# Patient Record
Sex: Male | Born: 1955 | Race: White | Hispanic: No | Marital: Single | Smoking: Current every day smoker
Health system: Southern US, Community
[De-identification: ages and names within clinical notes are randomized; demographics above are authoritative.]

## PROBLEM LIST (undated history)

## (undated) DIAGNOSIS — R61 Generalized hyperhidrosis: Secondary | ICD-10-CM

## (undated) DIAGNOSIS — Z86010 Personal history of colonic polyps: Secondary | ICD-10-CM

## (undated) DIAGNOSIS — E785 Hyperlipidemia, unspecified: Secondary | ICD-10-CM

## (undated) DIAGNOSIS — M1990 Unspecified osteoarthritis, unspecified site: Secondary | ICD-10-CM

## (undated) DIAGNOSIS — F329 Major depressive disorder, single episode, unspecified: Secondary | ICD-10-CM

## (undated) DIAGNOSIS — R1110 Vomiting, unspecified: Secondary | ICD-10-CM

## (undated) DIAGNOSIS — F1020 Alcohol dependence, uncomplicated: Secondary | ICD-10-CM

## (undated) DIAGNOSIS — K589 Irritable bowel syndrome without diarrhea: Secondary | ICD-10-CM

## (undated) DIAGNOSIS — Z86718 Personal history of other venous thrombosis and embolism: Secondary | ICD-10-CM

## (undated) DIAGNOSIS — J449 Chronic obstructive pulmonary disease, unspecified: Secondary | ICD-10-CM

## (undated) DIAGNOSIS — K069 Disorder of gingiva and edentulous alveolar ridge, unspecified: Secondary | ICD-10-CM

## (undated) DIAGNOSIS — M797 Fibromyalgia: Secondary | ICD-10-CM

## (undated) DIAGNOSIS — R110 Nausea: Secondary | ICD-10-CM

## (undated) DIAGNOSIS — I10 Essential (primary) hypertension: Secondary | ICD-10-CM

## (undated) DIAGNOSIS — F32A Depression, unspecified: Secondary | ICD-10-CM

## (undated) DIAGNOSIS — K219 Gastro-esophageal reflux disease without esophagitis: Secondary | ICD-10-CM

## (undated) DIAGNOSIS — R197 Diarrhea, unspecified: Secondary | ICD-10-CM

## (undated) HISTORY — DX: Nausea: R11.0

## (undated) HISTORY — PX: APPENDECTOMY: SHX54

## (undated) HISTORY — DX: Personal history of colonic polyps: Z86.010

## (undated) HISTORY — DX: Disorder of gingiva and edentulous alveolar ridge, unspecified: K06.9

## (undated) HISTORY — PX: FOOT SURGERY: SHX648

## (undated) HISTORY — DX: Irritable bowel syndrome, unspecified: K58.9

## (undated) HISTORY — DX: Personal history of other venous thrombosis and embolism: Z86.718

## (undated) HISTORY — PX: HAND SURGERY: SHX662

## (undated) HISTORY — DX: Hyperlipidemia, unspecified: E78.5

## (undated) HISTORY — DX: Depression, unspecified: F32.A

## (undated) HISTORY — DX: Major depressive disorder, single episode, unspecified: F32.9

## (undated) HISTORY — PX: COLONOSCOPY: SHX174

## (undated) HISTORY — PX: NOSE SURGERY: SHX723

## (undated) HISTORY — PX: POLYPECTOMY: SHX149

## (undated) HISTORY — DX: Diarrhea, unspecified: R19.7

## (undated) HISTORY — DX: Generalized hyperhidrosis: R61

## (undated) HISTORY — PX: COLECTOMY: SHX59

## (undated) HISTORY — DX: Alcohol dependence, uncomplicated: F10.20

## (undated) HISTORY — DX: Essential (primary) hypertension: I10

## (undated) HISTORY — DX: Chronic obstructive pulmonary disease, unspecified: J44.9

## (undated) HISTORY — DX: Fibromyalgia: M79.7

## (undated) HISTORY — DX: Unspecified osteoarthritis, unspecified site: M19.90

## (undated) HISTORY — DX: Vomiting, unspecified: R11.10

## (undated) HISTORY — DX: Gastro-esophageal reflux disease without esophagitis: K21.9
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
## 1994-06-04 DIAGNOSIS — M797 Fibromyalgia: Secondary | ICD-10-CM

## 1994-06-04 HISTORY — DX: Fibromyalgia: M79.7

## 1999-05-30 ENCOUNTER — Encounter: Admission: RE | Admit: 1999-05-30 | Discharge: 1999-05-30 | Payer: Self-pay | Admitting: Family Medicine

## 1999-06-14 ENCOUNTER — Encounter: Admission: RE | Admit: 1999-06-14 | Discharge: 1999-06-14 | Payer: Self-pay | Admitting: Family Medicine

## 1999-07-25 ENCOUNTER — Encounter: Admission: RE | Admit: 1999-07-25 | Discharge: 1999-07-25 | Payer: Self-pay | Admitting: Family Medicine

## 2000-04-27 ENCOUNTER — Emergency Department (HOSPITAL_COMMUNITY): Admission: EM | Admit: 2000-04-27 | Discharge: 2000-04-27 | Payer: Self-pay | Admitting: Emergency Medicine

## 2000-04-28 ENCOUNTER — Encounter: Payer: Self-pay | Admitting: Emergency Medicine

## 2000-04-28 ENCOUNTER — Emergency Department (HOSPITAL_COMMUNITY): Admission: EM | Admit: 2000-04-28 | Discharge: 2000-04-28 | Payer: Self-pay | Admitting: Emergency Medicine

## 2000-04-30 ENCOUNTER — Encounter: Admission: RE | Admit: 2000-04-30 | Discharge: 2000-04-30 | Payer: Self-pay | Admitting: Family Medicine

## 2000-05-08 ENCOUNTER — Encounter: Admission: RE | Admit: 2000-05-08 | Discharge: 2000-05-08 | Payer: Self-pay | Admitting: *Deleted

## 2000-05-08 ENCOUNTER — Encounter: Payer: Self-pay | Admitting: Family Medicine

## 2000-05-09 ENCOUNTER — Encounter: Admission: RE | Admit: 2000-05-09 | Discharge: 2000-05-09 | Payer: Self-pay | Admitting: Family Medicine

## 2000-06-18 ENCOUNTER — Ambulatory Visit (HOSPITAL_COMMUNITY): Admission: RE | Admit: 2000-06-18 | Discharge: 2000-06-18 | Payer: Self-pay | Admitting: *Deleted

## 2000-06-18 ENCOUNTER — Encounter (INDEPENDENT_AMBULATORY_CARE_PROVIDER_SITE_OTHER): Payer: Self-pay | Admitting: *Deleted

## 2000-08-12 ENCOUNTER — Encounter: Admission: RE | Admit: 2000-08-12 | Discharge: 2000-08-12 | Payer: Self-pay | Admitting: Family Medicine

## 2000-11-02 ENCOUNTER — Encounter: Payer: Self-pay | Admitting: Emergency Medicine

## 2000-11-02 ENCOUNTER — Emergency Department (HOSPITAL_COMMUNITY): Admission: EM | Admit: 2000-11-02 | Discharge: 2000-11-02 | Payer: Self-pay | Admitting: Emergency Medicine

## 2001-04-08 ENCOUNTER — Encounter: Payer: Self-pay | Admitting: Emergency Medicine

## 2001-04-08 ENCOUNTER — Emergency Department (HOSPITAL_COMMUNITY): Admission: EM | Admit: 2001-04-08 | Discharge: 2001-04-08 | Payer: Self-pay | Admitting: Emergency Medicine

## 2001-07-31 ENCOUNTER — Encounter: Admission: RE | Admit: 2001-07-31 | Discharge: 2001-07-31 | Payer: Self-pay | Admitting: Family Medicine

## 2001-08-08 ENCOUNTER — Encounter: Admission: RE | Admit: 2001-08-08 | Discharge: 2001-08-08 | Payer: Self-pay | Admitting: Urology

## 2001-08-08 ENCOUNTER — Encounter: Payer: Self-pay | Admitting: Urology

## 2001-08-19 ENCOUNTER — Encounter: Admission: RE | Admit: 2001-08-19 | Discharge: 2001-08-19 | Payer: Self-pay | Admitting: Family Medicine

## 2001-09-02 ENCOUNTER — Encounter: Admission: RE | Admit: 2001-09-02 | Discharge: 2001-09-02 | Payer: Self-pay | Admitting: Family Medicine

## 2010-01-21 ENCOUNTER — Encounter (INDEPENDENT_AMBULATORY_CARE_PROVIDER_SITE_OTHER): Payer: Self-pay | Admitting: Orthopedic Surgery

## 2010-01-21 ENCOUNTER — Ambulatory Visit (HOSPITAL_COMMUNITY): Admission: EM | Admit: 2010-01-21 | Discharge: 2010-01-21 | Payer: Self-pay | Admitting: Emergency Medicine

## 2010-06-03 ENCOUNTER — Emergency Department (HOSPITAL_COMMUNITY)
Admission: EM | Admit: 2010-06-03 | Discharge: 2010-06-03 | Payer: Self-pay | Source: Home / Self Care | Admitting: Emergency Medicine

## 2010-08-14 LAB — POCT CARDIAC MARKERS
CKMB, poc: <1 ng/mL — ABNORMAL LOW (ref 1.0–8.0)
Myoglobin, poc: 64.1 ng/mL (ref 12–200)
Troponin i, poc: <0.05 ng/mL (ref 0.00–0.09)

## 2010-08-14 LAB — POCT I-STAT, CHEM 8
BUN: 6 mg/dL (ref 6–23)
Chloride: 106 mEq/L (ref 96–112)
Potassium: 3.6 mEq/L (ref 3.5–5.1)
Sodium: 140 mEq/L (ref 135–145)

## 2010-10-20 ENCOUNTER — Ambulatory Visit (HOSPITAL_COMMUNITY)
Admission: RE | Admit: 2010-10-20 | Discharge: 2010-10-20 | Disposition: A | Discharge status: Home / Self Care | Payer: Medicaid Other | Source: Ambulatory Visit | Attending: Family Medicine | Admitting: Family Medicine

## 2010-10-20 ENCOUNTER — Other Ambulatory Visit (HOSPITAL_COMMUNITY): Payer: Self-pay | Admitting: Family Medicine

## 2010-10-20 ENCOUNTER — Ambulatory Visit (HOSPITAL_COMMUNITY): Payer: Medicaid Other

## 2010-10-20 DIAGNOSIS — R109 Unspecified abdominal pain: Secondary | ICD-10-CM | POA: Insufficient documentation

## 2010-10-20 DIAGNOSIS — R079 Chest pain, unspecified: Secondary | ICD-10-CM | POA: Insufficient documentation

## 2010-10-20 DIAGNOSIS — Z01812 Encounter for preprocedural laboratory examination: Secondary | ICD-10-CM | POA: Insufficient documentation

## 2010-10-20 DIAGNOSIS — R0602 Shortness of breath: Secondary | ICD-10-CM | POA: Insufficient documentation

## 2010-10-20 DIAGNOSIS — R52 Pain, unspecified: Secondary | ICD-10-CM

## 2010-10-20 LAB — COMPREHENSIVE METABOLIC PANEL
ALT: 34 U/L (ref 0–53)
Albumin: 4 g/dL (ref 3.5–5.2)
Alkaline Phosphatase: 99 U/L (ref 39–117)
Calcium: 9.4 mg/dL (ref 8.4–10.5)
Potassium: 4.6 mEq/L (ref 3.5–5.1)
Sodium: 141 mEq/L (ref 135–145)
Total Protein: 7.7 g/dL (ref 6.0–8.3)

## 2010-10-20 LAB — CBC
Platelets: 301 10*3/uL (ref 150–400)
RDW: 11.9 % (ref 11.5–15.5)
WBC: 7.4 10*3/uL (ref 4.0–10.5)

## 2010-10-20 LAB — LIPID PANEL
Cholesterol: 235 mg/dL — ABNORMAL HIGH (ref 0–200)
HDL: 49 mg/dL (ref >39)
Total CHOL/HDL Ratio: 4.8 RATIO
VLDL: 18 mg/dL (ref 0–40)

## 2010-10-20 LAB — PSA: PSA: 1.84 ng/mL (ref <=4.00)

## 2010-10-23 LAB — GIARDIA/CRYPTOSPORIDIUM SCREEN(EIA): Giardia Screen - EIA: NEGATIVE

## 2010-11-01 ENCOUNTER — Encounter: Payer: Self-pay | Admitting: Gastroenterology

## 2010-11-29 ENCOUNTER — Encounter: Payer: Self-pay | Admitting: Gastroenterology

## 2010-11-29 ENCOUNTER — Ambulatory Visit (INDEPENDENT_AMBULATORY_CARE_PROVIDER_SITE_OTHER): Payer: Medicaid Other | Admitting: Gastroenterology

## 2010-11-29 ENCOUNTER — Ambulatory Visit (AMBULATORY_SURGERY_CENTER): Payer: Medicaid Other | Admitting: Gastroenterology

## 2010-11-29 DIAGNOSIS — K219 Gastro-esophageal reflux disease without esophagitis: Secondary | ICD-10-CM

## 2010-11-29 DIAGNOSIS — Z860100 Personal history of colon polyps, unspecified: Secondary | ICD-10-CM | POA: Insufficient documentation

## 2010-11-29 DIAGNOSIS — R1013 Epigastric pain: Secondary | ICD-10-CM

## 2010-11-29 DIAGNOSIS — R197 Diarrhea, unspecified: Secondary | ICD-10-CM | POA: Insufficient documentation

## 2010-11-29 MED ORDER — OMEPRAZOLE 20 MG PO CPDR: 20.0000 mg | DELAYED_RELEASE_CAPSULE | Freq: Every day | ORAL | Status: DC

## 2010-11-29 MED ORDER — SODIUM CHLORIDE 0.9 % IV SOLN: 500.0000 mL | INTRAVENOUS | Status: DC

## 2010-11-29 NOTE — Assessment & Plan Note (Signed)
Plan followup colonoscopy 

## 2010-11-29 NOTE — Assessment & Plan Note (Addendum)
Diarrhea, in part, is probably a result of his partial colectomy. Worsening symptoms raise the question of other structural abnormalities of the colon.  Recommendations #1 begin Imodium 2 tabs every morning and every 6 hours when necessary #2 colonoscopy  Risks, alternatives, and complications of the procedure, including bleeding, perforation, and possible need for surgery, were explained to the patient.  Patient's questions were answered.

## 2010-11-29 NOTE — Progress Notes (Signed)
History of Present Illness:  Mr. Ardoin is a pleasant 55 year old white male referred at the request of Dr. Bruna Potter for evaluation of chest discomfort and diarrhea. Over 10 years ago he underwent a partial colectomy for adenomatous polyps. Since that time he's had loose stools. Over the past 2 months diarrhea has worsened. He now claims to move his bowels almost every 2 hours and immediately postprandially, accompanied by urgency. He denies rectal bleeding or melena. He claims to move his bowels up to every 2 hours and has to curtail his eating because of immediate postprandial diarrhea. Weight has been stable. He does not awaken to move his bowels. He has had no recent use of antibiotics or change in medications or diet. He also complains of burning chest discomfort that is slightly relieved with belching. He denies dysphagia.    Review of Systems: Pertinent positive and negative review of systems were noted in the above HPI section. All other review of systems were otherwise negative.    Current Medications, Allergies, Past Medical History, Past Surgical History, Family History and Social History were reviewed in [employer] electronic medical record  Vital signs were reviewed in today's medical record. Physical Exam: General: Well developed , well nourished, no acute distress Head: Normocephalic and atraumatic Eyes:  sclerae anicteric, EOMI Ears: Normal auditory acuity Mouth: No deformity or lesions Lungs: Clear throughout to auscultation Heart: Regular rate and rhythm; no murmurs, rubs or bruits Abdomen: Soft, non tender and non distended. No masses, hepatosplenomegaly or hernias noted. Normal Bowel sounds Rectal:deferred Musculoskeletal: Symmetrical with no gross deformities  Pulses:  Normal pulses noted Extremities: No clubbing, cyanosis, edema or deformities noted Neurological: Alert oriented x 4, grossly nonfocal Psychological:  Alert and cooperative. Normal mood and affect

## 2010-11-29 NOTE — Assessment & Plan Note (Signed)
With long standing reflux Barrett's esophagus should be ruled out.  Medications #1 begin omeprazole 20 mg daily #2 upper endoscopy

## 2010-11-29 NOTE — Patient Instructions (Addendum)
You have been scheduled for an endoscopy today, 11/29/10. Please follow written instructions given to you today. Begin Imodium 2 tabs upon awakening and every 6 hours as needed for diarrhea You are scheduled for a colonoscopy tomorrow, 11/30/10. Please follow written instructions given to you at your visit today.   Colonoscopy A colonoscopy is an exam to evaluate your entire colon. In this exam, your colon is cleansed. A long fiberoptic tube is inserted through your rectum and into your colon. The fiberoptic scope (endoscope) is a long bundle of enclosed and very flexible fibers. These fibers transmit light to the area examined and send images from that area to your caregiver. Discomfort is usually minimal. You may be given a drug to help you sleep (sedative) during or prior to the procedure. This exam helps to detect lumps (tumors), polyps, inflammation, and areas of bleeding. Your caregiver may also take a small piece of tissue (biopsy) that will be examined under a microscope. BEFORE THE PROCEDURE  A clear liquid diet may be required for 2 days before the exam.   Liquid injections (enemas) or laxatives may be required.   A large amount of electrolyte solution may be given to you to drink over a short period of time. This solution is used to clean out your colon.   You should be present 100 prior to your procedure or as directed by your caregiver.   Check in at the admissions desk to fill out necessary forms if not preregistered. There will be consent forms to sign prior to the procedure. If accompanied by friends or family, there is a waiting area for them while you are having your procedure.  LET YOUR CAREGIVER KNOW ABOUT:  Allergies to food or medicine.  Medicines taken, including vitamins, herbs, eyedrops, over-the-counter medicines, and creams.   Use of steroids (by mouth or creams).   Previous problems with anesthetics or numbing medicines.   History of bleeding problems or blood  clots.  Previous surgery.   Other health problems, including diabetes and kidney problems.   Possibility of pregnancy, if this applies.   AFTER THE PROCEDURE  If you received a sedative and/or pain medicine, you will need to arrange for someone to drive you home.   Occasionally, there is a little blood passed with the first bowel movement. DO NOT be concerned.  HOME CARE INSTRUCTIONS  It is not unusual to pass moderate amounts of gas and experience mild abdominal cramping following the procedure. This is due to air being used to inflate your colon during the exam. Walking or a warm pack on your belly (abdomen) may help.   You may resume all normal meals and activities after sedatives and medicines have worn off.   Only take over-the-counter or prescription medicines for pain, discomfort, or fever as directed by your caregiver. DO NOT use aspirin or blood thinners if a biopsy was taken. Consult your caregiver for medicine usage if biopsies were taken.  FINDING OUT THE RESULTS OF YOUR TEST Not all test results are available during your visit. If your test results are not back during the visit, make an appointment with your caregiver to find out the results. Do not assume everything is normal if you have not heard from your caregiver or the medical facility. It is important for you to follow up on all of your test results. SEEK IMMEDIATE MEDICAL CARE IF:  You have an oral temperature above 100, not controlled by medicine.   You pass large blood clots  or fill a toilet with blood following the procedure. This may also occur 10 to 14 days following the procedure. This is more likely if a biopsy was taken.   You develop abdominal pain that keeps getting worse and cannot be relieved with medicine.  Document Released: 05/18/2000 Document Re-Released: 08/15/2009 [facility] Patient Information 2011 [place], [place].  Polyps, Colon  A polyp is extra tissue that grows inside your body. Colon polyps  grow in the large intestine. The large intestine, also called the colon, is part of your digestive system. It is a long, hollow tube at the end of your digestive tract where your body makes and stores stool. Most polyps are not dangerous. They are benign. This means they are not cancerous. But over time, some types of polyps can turn into cancer. Polyps that are smaller than a pea are usually not harmful. But larger polyps could someday become or may already be cancerous. To be safe, doctors remove all polyps and test them.  WHO GETS POLYPS? Anyone can get polyps, but certain people are more likely than others. You may have a greater chance of getting polyps if:  You are over 50.   You have had polyps before.   Someone in your family has had polyps.   Someone in your family has had cancer of the large intestine.   Find out if someone in your family has had polyps. You may also be more likely to get polyps if you:   Eat a lot of fatty foods   Smoke   Drink alcohol   Do not exercise  Eat too much  SYMPTOMS Most small polyps do not cause symptoms. People often do not know they have one until their caregiver finds it during a regular checkup or while testing them for something else. Some people do have symptoms like these:  Bleeding from the anus. You might notice blood on your underwear or on toilet paper after you have had a bowel movement.   Constipation or diarrhea that lasts more than a week.   Blood in the stool. Blood can make stool look black or it can show up as red streaks in the stool.  If you have any of these symptoms, see your caregiver. HOW DOES THE DOCTOR TEST FOR POLYPS? The doctor can use four tests to check for polyps:  Digital rectal exam. The caregiver wears gloves and checks your rectum (the last part of the large intestine) to see if it feels normal. This test would find polyps only in the rectum. Your caregiver may need to do one of the other tests listed below to  find polyps higher up in the intestine.   Barium enema. The caregiver puts a liquid called barium into your rectum before taking x-rays of your large intestine. Barium makes your intestine look white in the pictures. Polyps are dark, so they are easy to see.   Sigmoidoscopy. With this test, the caregiver can see inside your large intestine. A thin flexible tube is placed into your rectum. The device is called a sigmoidoscope, which has a light and a tiny video camera in it. The caregiver uses the sigmoidoscope to look at the last third of your large intestine.   Colonoscopy. This test is like sigmoidoscopy, but the caregiver looks at all of the large intestine. It usually requires sedation. This is the most common method for finding and removing polyps.  TREATMENT  The caregiver will remove the polyp during sigmoidoscopy or colonoscopy. The polyp  is then tested for cancer.   If you have had polyps, your caregiver may want you to get tested regularly in the future.  PREVENTION There is not one sure way to prevent polyps. You might be able to lower your risk of getting them if you:  Eat more fruits and vegetables and less fatty food.   Do not smoke.   Avoid alcohol.   Exercise every day.   Lose weight if you are overweight.   Eating more calcium and folate can also lower your risk of getting polyps. Some foods that are rich in calcium are milk, cheese, and broccoli. Some foods that are rich in folate are chickpeas, kidney beans, and spinach.   Aspirin might help prevent polyps. Studies are under way.  Document Released: 02/15/2004 Document Re-Released: 11/08/2009 [facility] Patient Information 2011 [place], [place]  .Esophagitis (Heartburn) Esophagitis (heartburn) is a painful, burning sensation in the chest. It may feel worse in certain positions, such as lying down or bending over. It is caused by stomach acid backing up into the tube that carries food from the mouth down to the stomach  (lower esophagus). TREATMENT There are a number of non-prescription medicines used to treat heartburn, including:  Antacids.   Acid reducers (also called H-2 blockers).   Proton-pump inhibitors.  HOME CARE INSTRUCTIONS  Raise the head of your bead by putting blocks under the legs.   Eat 2-3 hours before going to bed.   Stop smoking.   Try to reach and maintain a healthy weight.   Do not eat just a few very large meals. Instead, eat many smaller meals throughout the day.   Try to identify foods and beverages that make your symptoms worse, and avoid these.   Avoid tight clothing.   Do not exercise right after eating.  SEEK IMMEDIATE MEDICAL CARE IF YOU:  Have severe chest pain that goes down your arm, or into your jaw or neck.   Feel sweaty, dizzy, or lightheaded.   Are short of breath.   Throw up (vomit) blood.   Have difficulty or pain with swallowing.   Have bloody or black, tarry stools.   Have bouts of heartburn more than three times a week for more than two weeks.  Document Released: 06/28/2004 Document Re-Released: 08/15/2009 [facility] Patient Information 2011 [place], [place].

## 2010-11-29 NOTE — Patient Instructions (Addendum)
Please review discharge instructions  Follow clear liquid diet today (for colonoscopy tomorrow)

## 2010-11-30 ENCOUNTER — Ambulatory Visit (AMBULATORY_SURGERY_CENTER): Payer: Medicaid Other | Admitting: Gastroenterology

## 2010-11-30 ENCOUNTER — Encounter: Payer: Self-pay | Admitting: Gastroenterology

## 2010-11-30 ENCOUNTER — Telehealth: Payer: Self-pay | Admitting: *Deleted

## 2010-11-30 DIAGNOSIS — R197 Diarrhea, unspecified: Secondary | ICD-10-CM

## 2010-11-30 DIAGNOSIS — Z860100 Personal history of colon polyps, unspecified: Secondary | ICD-10-CM

## 2010-11-30 DIAGNOSIS — D126 Benign neoplasm of colon, unspecified: Secondary | ICD-10-CM

## 2010-11-30 HISTORY — DX: Personal history of colonic polyps: Z86.010

## 2010-11-30 MED ORDER — SODIUM CHLORIDE 0.9 % IV SOLN: 500.0000 mL | INTRAVENOUS | Status: DC

## 2010-11-30 NOTE — Progress Notes (Signed)
Pt had a total of 26 polyps removed during today's procedure per MD.

## 2010-11-30 NOTE — Telephone Encounter (Signed)

## 2010-11-30 NOTE — Patient Instructions (Addendum)
Avoid aspirin and antiinflammatory medications for 2 weeks.  Polyps, Colon  A polyp is extra tissue that grows inside your body. Colon polyps grow in the large intestine. The large intestine, also called the colon, is part of your digestive system. It is a long, hollow tube at the end of your digestive tract where your body makes and stores stool. Most polyps are not dangerous. They are benign. This means they are not cancerous. But over time, some types of polyps can turn into cancer. Polyps that are smaller than a pea are usually not harmful. But larger polyps could someday become or may already be cancerous. To be safe, doctors remove all polyps and test them.  WHO GETS POLYPS? Anyone can get polyps, but certain people are more likely than others. You may have a greater chance of getting polyps if:  You are over 50.   You have had polyps before.   Someone in your family has had polyps.   Someone in your family has had cancer of the large intestine.   Find out if someone in your family has had polyps. You may also be more likely to get polyps if you:   Eat a lot of fatty foods   Smoke   Drink alcohol   Do not exercise  Eat too much  SYMPTOMS Most small polyps do not cause symptoms. People often do not know they have one until their caregiver finds it during a regular checkup or while testing them for something else. Some people do have symptoms like these:  Bleeding from the anus. You might notice blood on your underwear or on toilet paper after you have had a bowel movement.   Constipation or diarrhea that lasts more than a week.   Blood in the stool. Blood can make stool look black or it can show up as red streaks in the stool.  If you have any of these symptoms, see your caregiver. HOW DOES THE DOCTOR TEST FOR POLYPS? The doctor can use four tests to check for polyps:  Digital rectal exam. The caregiver wears gloves and checks your rectum (the last part of the large  intestine) to see if it feels normal. This test would find polyps only in the rectum. Your caregiver may need to do one of the other tests listed below to find polyps higher up in the intestine.   Barium enema. The caregiver puts a liquid called barium into your rectum before taking x-rays of your large intestine. Barium makes your intestine look white in the pictures. Polyps are dark, so they are easy to see.   Sigmoidoscopy. With this test, the caregiver can see inside your large intestine. A thin flexible tube is placed into your rectum. The device is called a sigmoidoscope, which has a light and a tiny video camera in it. The caregiver uses the sigmoidoscope to look at the last third of your large intestine.   Colonoscopy. This test is like sigmoidoscopy, but the caregiver looks at all of the large intestine. It usually requires sedation. This is the most common method for finding and removing polyps.  TREATMENT  The caregiver will remove the polyp during sigmoidoscopy or colonoscopy. The polyp is then tested for cancer.   If you have had polyps, your caregiver may want you to get tested regularly in the future.  PREVENTION There is not one sure way to prevent polyps. You might be able to lower your risk of getting them if you:  Eat more  fruits and vegetables and less fatty food.   Do not smoke.   Avoid alcohol.   Exercise every day.   Lose weight if you are overweight.   Eating more calcium and folate can also lower your risk of getting polyps. Some foods that are rich in calcium are milk, cheese, and broccoli. Some foods that are rich in folate are chickpeas, kidney beans, and spinach.   Aspirin might help prevent polyps. Studies are under way.  Document Released: 02/15/2004 Document Re-Released: 11/08/2009 [facility] Patient Information 2011 [place], [place]. DISCHARGE INSTRUCTIONS GIVEN WITH VERBAL UNDERSTANDING.

## 2010-12-01 ENCOUNTER — Telehealth: Payer: Self-pay | Admitting: *Deleted

## 2010-12-01 NOTE — Telephone Encounter (Signed)

## 2010-12-04 ENCOUNTER — Telehealth: Payer: Self-pay | Admitting: Gastroenterology

## 2010-12-04 NOTE — Telephone Encounter (Signed)
Spoke with [name] and let him know that the path results are not back yet. [name] scheduled to see Dr. [name] 12/19/10@11 :15am. [name] aware of appt date and time.

## 2010-12-08 ENCOUNTER — Emergency Department (HOSPITAL_COMMUNITY)
Admission: EM | Admit: 2010-12-08 | Discharge: 2010-12-08 | Disposition: A | Discharge status: Home / Self Care | Payer: Medicaid Other | Attending: Emergency Medicine | Admitting: Emergency Medicine

## 2010-12-08 ENCOUNTER — Emergency Department (HOSPITAL_COMMUNITY): Payer: Medicaid Other

## 2010-12-08 DIAGNOSIS — K2920 Alcoholic gastritis without bleeding: Secondary | ICD-10-CM | POA: Insufficient documentation

## 2010-12-08 DIAGNOSIS — Z87898 Personal history of other specified conditions: Secondary | ICD-10-CM | POA: Insufficient documentation

## 2010-12-08 DIAGNOSIS — R109 Unspecified abdominal pain: Secondary | ICD-10-CM | POA: Insufficient documentation

## 2010-12-08 DIAGNOSIS — F341 Dysthymic disorder: Secondary | ICD-10-CM | POA: Insufficient documentation

## 2010-12-08 DIAGNOSIS — Z79899 Other long term (current) drug therapy: Secondary | ICD-10-CM | POA: Insufficient documentation

## 2010-12-08 DIAGNOSIS — R1032 Left lower quadrant pain: Secondary | ICD-10-CM | POA: Insufficient documentation

## 2010-12-08 LAB — CBC
Platelets: 263 10*3/uL (ref 150–400)
RDW: 11.8 % (ref 11.5–15.5)
WBC: 8.2 10*3/uL (ref 4.0–10.5)

## 2010-12-08 LAB — DIFFERENTIAL
Basophils Absolute: 0 10*3/uL (ref 0.0–0.1)
Basophils Relative: 1 % (ref 0–1)
Eosinophils Absolute: 0.4 10*3/uL (ref 0.0–0.7)
Eosinophils Relative: 5 % (ref 0–5)
Lymphocytes Relative: 24 % (ref 12–46)

## 2010-12-08 LAB — URINALYSIS, ROUTINE W REFLEX MICROSCOPIC
Nitrite: NEGATIVE
Specific Gravity, Urine: 1.008 (ref 1.005–1.030)
Urobilinogen, UA: 0.2 mg/dL (ref 0.0–1.0)

## 2010-12-08 LAB — COMPREHENSIVE METABOLIC PANEL
ALT: 23 U/L (ref 0–53)
Albumin: 3.7 g/dL (ref 3.5–5.2)
Alkaline Phosphatase: 75 U/L (ref 39–117)
Calcium: 8.4 mg/dL (ref 8.4–10.5)
Potassium: 3.4 mEq/L — ABNORMAL LOW (ref 3.5–5.1)
Sodium: 142 mEq/L (ref 135–145)
Total Protein: 7.2 g/dL (ref 6.0–8.3)

## 2010-12-08 LAB — LIPASE, BLOOD: Lipase: 22 U/L (ref 11–59)

## 2010-12-08 LAB — ETHANOL: Alcohol, Ethyl (B): 143 mg/dL — ABNORMAL HIGH (ref 0–11)

## 2010-12-19 ENCOUNTER — Encounter: Payer: Self-pay | Admitting: Gastroenterology

## 2010-12-19 ENCOUNTER — Ambulatory Visit (INDEPENDENT_AMBULATORY_CARE_PROVIDER_SITE_OTHER): Payer: Medicaid Other | Admitting: Gastroenterology

## 2010-12-19 VITALS — BP 116/82 | HR 60 | Ht 71.0 in | Wt 160.6 lb

## 2010-12-19 DIAGNOSIS — Z860100 Personal history of colon polyps, unspecified: Secondary | ICD-10-CM

## 2010-12-19 DIAGNOSIS — R197 Diarrhea, unspecified: Secondary | ICD-10-CM

## 2010-12-19 MED ORDER — HYOSCYAMINE SULFATE ER 0.375 MG PO TBCR: EXTENDED_RELEASE_TABLET | ORAL | Status: DC

## 2010-12-19 NOTE — Patient Instructions (Signed)
You will need to be referred to Dr Aura Camps office from Dr Blima Singer office for consideration of left hemicolectomy.  We are not able to refer due to [place] [facility].  Please contact Dr Raquel James office  To schedule the appointment so they can get you referred

## 2010-12-19 NOTE — Assessment & Plan Note (Signed)
He clearly has colonic polyposis with predominance of his polyps on left side. In view of his colon cancer risk I would consider a left hemicolectomy or perhaps subtotal colectomy.  Conditions #1 surgical consultation-Dr. Delane Ginger

## 2010-12-19 NOTE — Progress Notes (Signed)
History of Present Illness:  [name] has returned following upper and lower endoscopy.  At endoscopy esophagitis was seen. He had multiple colonic polyps including 3-4 in the colon  proximal to the splenic flexure and at least 30 in the left colon. Biopsies demonstrated adenomatous changes and a serrated adenoma. Random biopsies were negative for microscopic colitis. Imodium has helped his stool frequency. He still complains of some bloating and abdominal discomfort.   Review of Systems: Pertinent positive and negative review of systems were noted in the above HPI section. All other review of systems were otherwise negative.    Current Medications, Allergies, Past Medical History, Past Surgical History, Family History and Social History were reviewed in [facility] electronic medical record  Vital signs were reviewed in today's medical record. Physical Exam: General: Well developed , well nourished, no acute distress

## 2010-12-19 NOTE — Assessment & Plan Note (Signed)
Loose stools may be result of his partial colonic resection.  Mediations #1 continue Imodium #2 hyomax as needed

## 2010-12-26 ENCOUNTER — Telehealth: Payer: Self-pay | Admitting: *Deleted

## 2010-12-26 NOTE — Telephone Encounter (Signed)
ok 

## 2010-12-26 NOTE — Telephone Encounter (Signed)
DR Leota Jacobsen, INSURANCE WOULD NOT COVER PTS HYOMAX 0.375 BUT THEY WILL COVER LOWER DOSE OF 0.125. SO I CALLED THAT PRESCRIPTION IN FOR PT...Marland KitchenMarland Kitchen

## 2010-12-28 ENCOUNTER — Ambulatory Visit (INDEPENDENT_AMBULATORY_CARE_PROVIDER_SITE_OTHER): Payer: Medicaid Other | Admitting: General Surgery

## 2010-12-28 ENCOUNTER — Encounter (INDEPENDENT_AMBULATORY_CARE_PROVIDER_SITE_OTHER): Payer: Self-pay | Admitting: General Surgery

## 2010-12-28 VITALS — BP 130/84 | HR 78 | Temp 97.6°F | Ht 71.0 in | Wt 159.6 lb

## 2010-12-28 DIAGNOSIS — K635 Polyp of colon: Secondary | ICD-10-CM

## 2010-12-28 DIAGNOSIS — D126 Benign neoplasm of colon, unspecified: Secondary | ICD-10-CM

## 2010-12-28 NOTE — Patient Instructions (Signed)
You have extensive colon polyps on the left side of your colon extending down toward the rectum. These are too numerous to remove. They are the type of polyps that can become cancer in the future. We have advised you to have the left half of the colon removed. You have stated that you would like to have this done. We would like to get the records from [facility] that described the colon resection you had in 1996. After that is done we will schedule you for a colon resection at the hospital in the near future. I strongly urge to stop smoking now and certainly don't smoke for 7-10 days prior to the surgery. I strongly urge you not to drink any alcohol between now and the time of surgery.

## 2010-12-28 NOTE — Progress Notes (Signed)
Subjective:     Patient ID: [name], male   DOB: 22-Oct-1955, 55 y.o.   MRN: [id number]  HPI This is [name], referred to me by Dr. Clyda Greener for consideration of left colectomy for colonic polyposis. I have discussed his colonic endoscopy findings with Dr. Melvia Heaps.  The patient states he had a colon resection at [facility] in [place] in 1996. He states this was done for polyps. He does not have a history of cancer.records are pending.  He gives a 3 to four-year history of intermittent left-sided abdominal pain with lots of gurgling and occasional cramps. He denies nausea vomiting fever or chills. More recently, for the last 55 year, he has had diarrhea and does not know the cause of this. He has been evaluated in the emergency room with no specific diagnosis.  Chest x-ray at performed on Oct 20, 2010 is unremarkable. CT scan of the abdomen and pelvis performed on December 08, 2010 is unremarkable. Upper endoscopy by Dr. Melvia Heaps on November 29, 2010 reveals multiple erosions at the gastroesophageal junction. This is consistent with reflux esophagitis. Patient states he has markedly reduced his alcohol intake since that time.  Total colonoscopy was performed by Dr. [name] on November 30, 2010. There were multiple polyps identified and removed. There were some small polyps in a standing and transverse colon: Which were totally removed. There were numerous polyps in the splenic flexure and descending colon and sigmoid colon and proximal rectum some of which were removed. He thinks there is sparing of the distal rectum. The pathology report shows no evidence of colitis or active inflammation or granulomas. The numerous polyps that were removed reveal tubular adenomas but no evidence of dysplasia or malignancy.  He was advised to have his left colon removed because of the numerous premalignant polyps. The patient is in favor of this. His diarrhea is  better on Imodium.  Past Medical History  Diagnosis Date  . Hx of colonic polyps     adenomatous polyp; had prior colectomy  . Arthritis   . Emphysema   . GERD (gastroesophageal reflux disease)   . Fibromyalgia   . COPD (chronic obstructive pulmonary disease)   . Depression   . Hyperlipidemia   . IBS (irritable bowel syndrome)   . Hearing loss   . Hypertension   . Fibromyalgia 1996  . Abdominal pain   . Night sweats   . Gum disease     Current Outpatient Prescriptions  Medication Sig Dispense Refill  . Hyoscyamine Sulfate (HYOMAX-DT) 0.375 MG TBCR Take one tab twice a day as needed for abdominal discomfort  30 each  1  . omeprazole (PRILOSEC) 20 MG capsule Take 1 capsule (20 mg total) by mouth daily.  30 capsule  1   Current Facility-Administered Medications  Medication Dose Route Frequency Provider Last Rate Last Dose  . 0.9 %  sodium chloride infusion  500 mL Intravenous Continuous Louis Meckel, MD        Allergies  Allergen Reactions  . Iohexol Hives    IVP CONTRAST    Family History  Problem Relation Age of Onset  . Hypertension Mother   . Diabetes Mother   . Breast cancer Mother   . Breast cancer Sister     x 2  . Cancer Sister     breast  . Stomach cancer Maternal Grandfather   . Hypertension Sister     x2    History  Substance Use Topics  . Smoking status: Current Everyday Smoker -- 1.0 packs/day    Types: Cigarettes  . Smokeless tobacco: Never Used  . Alcohol Use: Yes     4 drinks daily     Review of Systems  Constitutional: Negative.   HENT: Negative.   Eyes: Negative.   Respiratory: Positive for shortness of breath. Negative for apnea, cough, choking, chest tightness, wheezing and stridor.   Cardiovascular: Negative.   Gastrointestinal: Positive for abdominal pain and diarrhea. Negative for nausea, vomiting, constipation, blood in stool, abdominal distention, anal bleeding and rectal pain.  Genitourinary: Negative.   Musculoskeletal:  Positive for myalgias and arthralgias. Negative for back pain, joint swelling and gait problem.  Neurological: Negative.   Hematological: Negative.   Psychiatric/Behavioral: Negative.        Objective:   Physical Exam  Constitutional: He is oriented to person, place, and time. He appears well-developed and well-nourished. No distress.  HENT:  Head: Normocephalic and atraumatic.  Mouth/Throat: No oropharyngeal exudate.  Eyes: Conjunctivae are normal. Pupils are equal, round, and reactive to light. No scleral icterus.  Neck: Neck supple. No JVD present. No tracheal deviation present. No thyromegaly present.  Cardiovascular: Normal rate, regular rhythm and normal heart sounds.   No murmur heard. Pulmonary/Chest: Effort normal and breath sounds normal. No respiratory distress. He has no wheezes. He has no rales. He exhibits no tenderness.  Abdominal: Soft. Bowel sounds are normal. He exhibits no distension and no mass. There is no tenderness. There is no rebound and no guarding.    Musculoskeletal: Normal range of motion. He exhibits no edema.  Lymphadenopathy:    He has no cervical adenopathy.  Neurological: He is alert and oriented to person, place, and time. He exhibits normal muscle tone.  Skin: Skin is warm and dry. No rash noted. No erythema. No pallor.  Psychiatric: He has a normal mood and affect. His behavior is normal. Judgment and thought content normal.       Assessment:     Extensive left-sided polyposis. Non-familial polyposis syndrome.  Past history colon resection for colon polyps, records pending.  Recent onset diarrhea, uncertain etiology.  Tobacco abuse  History of alcohol abuse, abstinent for 3 weeks.    Plan:     We'll obtain records from [facility] from his 1996 colon resection to be sure we understand what was done and what the pathology report was.  We will tentatively schedule him for an extended left colectomy with takedown splenic  flexure in the near future. We will try to leave his right colon in place for water reabsorptionr and hopefully will avoid excessive diarrhea in the postop period  Perioperative entereg protocol.  One-day bowel prep preop.  He was strongly advised to stop smoking and stop drinking immediately preop  I have discussed the indications and details of colon resection with him. Risks and complications have been outlined, including but not limited to bleeding, infection, anastomotic leak with reoperation and colostomy, sepsis, injury to adjacent organs such as the ureter, bladder or major vascular structures of with reconstructive issues, wound healing and problems and hernia, cardiac,pulmonary and thromboembolic problems. He seems to understand these issues well. At this time all of his questions were answered. He is in full agreement with this plan.

## 2011-01-17 ENCOUNTER — Other Ambulatory Visit (INDEPENDENT_AMBULATORY_CARE_PROVIDER_SITE_OTHER): Payer: Self-pay | Admitting: General Surgery

## 2011-01-17 ENCOUNTER — Encounter (HOSPITAL_COMMUNITY): Payer: Medicaid Other

## 2011-01-17 LAB — CBC
HCT: 47.1 % (ref 39.0–52.0)
MCV: 88.7 fL (ref 78.0–100.0)
Platelets: 324 10*3/uL (ref 150–400)
RBC: 5.31 MIL/uL (ref 4.22–5.81)
RDW: 12.1 % (ref 11.5–15.5)
WBC: 8.8 10*3/uL (ref 4.0–10.5)

## 2011-01-17 LAB — URINALYSIS, ROUTINE W REFLEX MICROSCOPIC
Bilirubin Urine: NEGATIVE
Ketones, ur: NEGATIVE mg/dL
Nitrite: NEGATIVE
Protein, ur: NEGATIVE mg/dL
pH: 6.5 (ref 5.0–8.0)

## 2011-01-17 LAB — COMPREHENSIVE METABOLIC PANEL
ALT: 18 U/L (ref 0–53)
AST: 20 U/L (ref 0–37)
Alkaline Phosphatase: 88 U/L (ref 39–117)
CO2: 27 mEq/L (ref 19–32)
Calcium: 9.3 mg/dL (ref 8.4–10.5)
Chloride: 107 mEq/L (ref 96–112)
GFR calc Af Amer: >60 mL/min (ref >60)
GFR calc non Af Amer: >60 mL/min (ref >60)
Glucose, Bld: 85 mg/dL (ref 70–99)
Sodium: 142 mEq/L (ref 135–145)
Total Bilirubin: 0.6 mg/dL (ref 0.3–1.2)

## 2011-01-17 LAB — DIFFERENTIAL
Basophils Absolute: 0.1 10*3/uL (ref 0.0–0.1)
Eosinophils Relative: 3 % (ref 0–5)
Lymphocytes Relative: 20 % (ref 12–46)
Lymphs Abs: 1.7 10*3/uL (ref 0.7–4.0)
Neutro Abs: 5.8 10*3/uL (ref 1.7–7.7)

## 2011-01-24 ENCOUNTER — Other Ambulatory Visit (INDEPENDENT_AMBULATORY_CARE_PROVIDER_SITE_OTHER): Payer: Self-pay | Admitting: General Surgery

## 2011-01-24 ENCOUNTER — Inpatient Hospital Stay (HOSPITAL_COMMUNITY)
Admission: RE | Admit: 2011-01-24 | Discharge: 2011-01-30 | DRG: 330 | Disposition: A | Discharge status: Home / Self Care | Payer: Medicaid Other | Source: Ambulatory Visit | Attending: General Surgery | Admitting: General Surgery

## 2011-01-24 ENCOUNTER — Inpatient Hospital Stay (HOSPITAL_COMMUNITY): Payer: Medicaid Other

## 2011-01-24 DIAGNOSIS — Y832 Surgical operation with anastomosis, bypass or graft as the cause of abnormal reaction of the patient, or of later complication, without mention of misadventure at the time of the procedure: Secondary | ICD-10-CM | POA: Diagnosis not present

## 2011-01-24 DIAGNOSIS — K219 Gastro-esophageal reflux disease without esophagitis: Secondary | ICD-10-CM | POA: Diagnosis present

## 2011-01-24 DIAGNOSIS — K560 Paralytic ileus: Secondary | ICD-10-CM | POA: Diagnosis not present

## 2011-01-24 DIAGNOSIS — J449 Chronic obstructive pulmonary disease, unspecified: Secondary | ICD-10-CM | POA: Diagnosis present

## 2011-01-24 DIAGNOSIS — Z01812 Encounter for preprocedural laboratory examination: Secondary | ICD-10-CM

## 2011-01-24 DIAGNOSIS — IMO0002 Reserved for concepts with insufficient information to code with codable children: Secondary | ICD-10-CM | POA: Diagnosis not present

## 2011-01-24 DIAGNOSIS — IMO0001 Reserved for inherently not codable concepts without codable children: Secondary | ICD-10-CM | POA: Diagnosis present

## 2011-01-24 DIAGNOSIS — J4489 Other specified chronic obstructive pulmonary disease: Secondary | ICD-10-CM | POA: Diagnosis present

## 2011-01-24 DIAGNOSIS — D126 Benign neoplasm of colon, unspecified: Principal | ICD-10-CM | POA: Diagnosis present

## 2011-01-24 DIAGNOSIS — K929 Disease of digestive system, unspecified: Secondary | ICD-10-CM | POA: Diagnosis not present

## 2011-01-24 DIAGNOSIS — F17200 Nicotine dependence, unspecified, uncomplicated: Secondary | ICD-10-CM | POA: Diagnosis present

## 2011-01-24 DIAGNOSIS — Z9049 Acquired absence of other specified parts of digestive tract: Secondary | ICD-10-CM

## 2011-01-24 HISTORY — PX: PROCTOSCOPY: SHX2266

## 2011-01-24 HISTORY — PX: HEMICOLECTOMY: SHX854

## 2011-01-24 LAB — ABO/RH: ABO/RH(D): O NEG

## 2011-01-24 LAB — TYPE AND SCREEN: ABO/RH(D): O NEG

## 2011-01-25 LAB — BASIC METABOLIC PANEL
BUN: 11 mg/dL (ref 6–23)
CO2: 26 mEq/L (ref 19–32)
Chloride: 102 mEq/L (ref 96–112)
Creatinine, Ser: 0.97 mg/dL (ref 0.50–1.35)
GFR calc Af Amer: >60 mL/min (ref >60)
Potassium: 4.1 mEq/L (ref 3.5–5.1)

## 2011-01-25 LAB — CBC
HCT: 44 % (ref 39.0–52.0)
Hemoglobin: 15.5 g/dL (ref 13.0–17.0)
MCV: 90.2 fL (ref 78.0–100.0)
RBC: 4.88 MIL/uL (ref 4.22–5.81)
RDW: 12.1 % (ref 11.5–15.5)
WBC: 16.8 10*3/uL — ABNORMAL HIGH (ref 4.0–10.5)

## 2011-01-27 LAB — BASIC METABOLIC PANEL
BUN: 6 mg/dL (ref 6–23)
Chloride: 103 mEq/L (ref 96–112)
Creatinine, Ser: 0.77 mg/dL (ref 0.50–1.35)
GFR calc non Af Amer: >60 mL/min (ref >60)
Glucose, Bld: 131 mg/dL — ABNORMAL HIGH (ref 70–99)
Potassium: 3.9 mEq/L (ref 3.5–5.1)

## 2011-01-27 LAB — CBC
HCT: 41 % (ref 39.0–52.0)
Hemoglobin: 13.9 g/dL (ref 13.0–17.0)
MCHC: 33.9 g/dL (ref 30.0–36.0)
MCV: 91.1 fL (ref 78.0–100.0)

## 2011-01-29 NOTE — Op Note (Signed)
NAMESRIHAN, [name] NO.:  [id number]  MEDICAL RECORD NO.:  [id number]  LOCATION:  1533                         FACILITY:  [facility]  PHYSICIAN:  [name]. [name], M.D.DATE OF BIRTH:  Mar 06, 1956  DATE OF PROCEDURE:  01/24/2011 DATE OF DISCHARGE:                              OPERATIVE REPORT   PREOPERATIVE DIAGNOSIS:  Nonfamilial colonic polyposis syndrome.  POSTOPERATIVE DIAGNOSIS:  Nonfamilial colonic polyposis syndrome.  OPERATION PERFORMED: 1. Extended left colectomy. 2. Coloproctostomy with a 29-mm EEA stapler. 3. Takedown of splenic flexure. 4. Appendectomy.  SURGEON:  [name]. [name], M.D.  FIRST ASSISTANT:  Ollen Gross. Vernell Morgans, M.D.  OPERATIVE INDICATIONS:  This is [name] who had a colon resection at [facility] in [place], [place], in 1996 for colonic polyps with dysplasia.  This was apparently a sigmoid colectomy.  He has subsequently had recurrent polyps, having hada colonoscopy a few years ago by Dr. Roosvelt Harps, removing adenomas with dysplasia.  He had a recent colonoscopy by Dr. Melvia Heaps, which showed multiple polyps.  There was occasional polyps in the right colon and the transverse colon, which were removed.  There were numerous polyps from the splenic flexure down to the proximal rectum, some which were biopsied but the majority were left behind.  He did not have any evidence of colitis grossly or by histology.  Because of the numerous left-sided colon polyps, he was referred for consideration of elective left colectomy to reduce his risk for colorectal cancer.  This was discussed with the patient.  He was offered left colectomy and decided to go ahead and have that done.  He has undergone a bowel prep at home and is brought to the operating room electively.  OPERATIVE FINDINGS:  The patient had extensive adhesions of the small bowel to the anterior abdominal wall.  We had 1 serosal  injury, which required an enterorrhaphy, but no full-thickness injuries, and that was the only abnormality of the small bowel.  In the deeper abdomen, there were very few adhesions.  It looked like he had an anastomosis of the mid-to-proximal sigmoid.  The rectum looked normal.  There were no sutures or signs of any anastomosis in the rectum.  The appendix was normal.  Because of the extensive adhesions, the appendix was removed as an incidental procedure.  There was no palpable mass in the colon that I could detect.  The splenic flexure was quite high and did not look like it had never been taken down before.  Proctoscopy was performed prior to the procedure and I did not see any polyps for about 15 cm and then I just saw 1 or 2 very tiny hyperplastic polyps.  For this reason, we felt that anastomosis at the mid-rectum would be adequate.  When we removed the specimen, both the proximal and distal margins looked clean without any polyps within a few centimeters of the resection margins.  OPERATIVE TECHNIQUE:  Following the induction of general endotracheal anesthesia, a Foley catheter was placed.  The abdomen and perineum were prepped and draped in a sterile fashion with the patient in lithotomy position.  Rigid proctoscopy was carried out to about 22  cm.  I only saw 1 or 2 tiny hyperplastic polyps up around 18 cm..  Intravenous antibiotics were given.  A surgical time-out was held, identifying correct patient and correct procedure.  A midline laparotomy incision was made through the previous scar, which extended from well below the umbilicus to several centimeters above the umbilicus.  The fascia was incised in the midline.  The abdominal cavity was entered. As mentioned above, there were dense omental and small bowel adhesions to the anterior abdominal wall.  These were taken down.  There was 1 linear serosal tear to the mid-small bowel and this was closed carefully with numerous  interrupted 3-0 silk sutures.  This was examined several times during the case, and there was no sign of any stricture or leak or ischemia.  We took down the remaining intra-abdominal adhesions and placed a self- retaining retractor.  We mobilized the distal sigmoid colon by dividing its lateral peritoneal attachments.  We opened up the retroperitoneum to the right and left of the rectum.  We identified the left ureter and it was preserved and not injured.  We decided to transect the rectum in its midportion, several centimeters above the peritoneal reflection, probably 3-4 cm above the peritoneal reflection.  This division was performed with a GIA stapling device.  We then mobilized the proximal rectum, what was left of the sigmoid colon, and the splenic flexure sequentially.  We kept the dissection close to the colon.  We ligated the superior hemorrhoidal vessels with 2-0 silk ties.  Smaller mesenteric vessels were divided using the ultrasonic device.  We had good hemostasis.  The distal transverse colon and proximal descending colon were adherent to each other and we had to carefully take down the adhesions between them to open this up.  We took the dissection up and slowly took the splenic flexure down.  The splenic flexure was quite high and we had to do this slowly and carefully, but this occurred without incident.  We mobilized beyond the splenic flexure proximally until we could identify where we wanted to transect the left transverse colon.  We did use a GIA stapler and transected the left transverse colon about 10 cm proximal to the splenic flexure.  We then finished the division of the mesentery of the specimen and removed it.  We opened the specimen on the side table.  The proximal and distal margins looked clean without any polyps.  We sent the specimen to the lab, and Dr. Dierdre Searles looked at the specimen and also thought that the proximal and distal margins were clean.  We then  further mobilized the transverse colon.  We mobilized the omentum completely off the transverse colon.  We had to divide 1 more piece of the mesentery and then we found that the left transverse colon would come down into the pelvis for the anastomosis and there was no tension whatsoever.  We watched this for about 30 minutes and it remained pink and seemed to have good blood supply.  We irrigated the abdomen and pelvis.  We opened up the proximal specimen by amputating the staple line.  We found that we could use a 29 mm EEA stapler without any difficulty.  We placed a pursestring suture of #0 Prolene in the proximal colon and then placed an anvil into the pursestring and tied the pursestring down.  We debrided very little fat.  Dr. Carolynne Edouard then went and inserted the stapler transanally until we could see it coming up through the  rectal stump. He opened the stapler so that the spike came out anterior to the staple line.  We then secured the proximal colon and the anvil on to the stapler.  Great care was taken to make sure there was no twisting of the mesentery.  The stapler was then closed, held in place for a minute, fired, opened, and removed.  We had 2 complete donuts.  Dr. Carolynne Edouard insufflated the colon and initially there was no leak, but afterinsufflating under a great deal of tension, we had a tiny stream of air bubbles from the anterior part of the anastomosis.  I placed 5 interrupted sutures of 2-0 silk there, and then on reexamination proctoscopically the staple line looked very good and there was no air leak whatsoever.  We changed our instruments and gloves.  We irrigated the abdomen and pelvis extensively.  There was no bleeding.  I placed a 19-French Blake drain down into the pelvis and brought that out through a stab wound in the left lower quadrant, suturing the drain to the skin with a nylon suture.  We examined the small bowel from ligament of Treitz to the ileocecal valve  and everything looked good.  The enterorrhaphy closure looked good.  We decided to take the appendix out.  We transected the appendiceal mesentery with the ultrasonic device and we transected the appendix with a TA 30 stapler with a 2.5 mm staple load.  The appendix was removed.  The staple line looked good.  We irrigated one more time. We did not close the mesentery as that would have been a complex, extensive closure.  We brought the small bowel up anterior to the left colon.  We looked at the anastomosis one more time and it looked very good.  The omentum and small bowel were placed back in their anatomic positions.  The midline fascia was closed with a running suture of #1, double-stranded PDS.  The skin was closed loosely with skin staples and Telfa wicks were placed in between the skin staples.  Clean bandages were placed.  The drain tube was placed on a suction bulb.  The patient tolerated the procedure well and was taken to the recovery room in stable condition.  During the case, one small SH needle came off the needle holder and flew off the table.  We never found that suture.  We then brought the x-ray staff into the operating room and took 2 films of the abdomen and reviewed these with the radiologist.  There was no needle seen on any of the radiographs.  We felt that there had not been a retained needle. The sponge and instrument counts were correct.  Clean bandages were placed.  The patient was taken to the recovery room in stable condition.  Estimated blood loss was about 250 cc. Complications, none.     [name]. [name], M.D.     HMI/MEDQ  D:  01/24/2011  T:  01/25/2011  Job:  [id number]  cc:   [name]. [name], MD,FACG [address] [place] [address]  Clyda Greener, MD Fax: [phone]  Electronically Signed by Claud Kelp M.D. on 01/29/2011 07:08:26 PM

## 2011-01-30 ENCOUNTER — Telehealth (INDEPENDENT_AMBULATORY_CARE_PROVIDER_SITE_OTHER): Payer: Self-pay

## 2011-01-30 NOTE — Telephone Encounter (Signed)
Per Dr Jacinto Halim request pt given appt for 02-06-11 for staple removal.

## 2011-02-06 ENCOUNTER — Ambulatory Visit (INDEPENDENT_AMBULATORY_CARE_PROVIDER_SITE_OTHER): Payer: Medicaid Other | Admitting: General Surgery

## 2011-02-06 ENCOUNTER — Other Ambulatory Visit (INDEPENDENT_AMBULATORY_CARE_PROVIDER_SITE_OTHER): Payer: Self-pay | Admitting: General Surgery

## 2011-02-06 ENCOUNTER — Encounter (INDEPENDENT_AMBULATORY_CARE_PROVIDER_SITE_OTHER): Payer: Self-pay | Admitting: General Surgery

## 2011-02-06 DIAGNOSIS — R109 Unspecified abdominal pain: Secondary | ICD-10-CM

## 2011-02-06 DIAGNOSIS — Z9889 Other specified postprocedural states: Secondary | ICD-10-CM

## 2011-02-06 NOTE — Patient Instructions (Signed)
I do not know why you started vomiting today. You do not have any fever. You do not have any evidence of wound infection. I have asked you to go back on a liquid diet for the next 24 hours. I have given you a prescription for Phenergan for nausea and omeprazole for heartburn. You will be scheduled for lab work and a CT scan of the abdomen. If you continue to vomit, please go to the emergency room. If you feel better I will see you back in the office in one week.

## 2011-02-06 NOTE — Progress Notes (Signed)
Subjective:     Patient ID: [name], male   DOB: 1955/11/22, 55 y.o.   MRN: [id number]  HPI This patient underwent extended left colectomy with takedown of splenic flexure and a stapled anastomosis between the left transverse colon and the mid rectum approximately 2 weeks ago. He had no problems in the hospital and has felt well until today. He has been having loose bowel movements and passing lots of flatus. He has been eating. Today he has had 3 or 4 episodes of vomiting, and he states that it has an odor to it and is brown. He has a little more abdominal pain today than yesterday. He denies fever or chills. He is voiding uneventfully.  His final pathology report showed multiple villous adenomas of the left colon. There was no cancer. He is aware of his pathology report.  He continues to smoke, although he has cut back.  Review of Systems     Objective:   Physical Exam Patient does not appear ill or toxic. He is thin. Weight 161.8 pounds. Blood pressure 102/78. Pulse 58 and regular. Temp. 98.0.  Lungs clear to auscultation.  Abdomen soft. Appropriate incisional tenderness. Bowel sounds present. Not obviously distended. Wound appears to be healing without drainage or infection. Staples are removed.    Assessment:     Nausea and vomiting of uncertain etiology. Onset today.  Recent extended left colectomy with coloproctostomy for multiple villous adenomas.  Tobacco abuse.    Plan:     Proceed with evaluation to rule out surgical complication.  Liquid diet. Fill prescriptions for Phenergan and omeprazole.  Scheduled for laboratory including CBC, complete metabolic panel, lipase.  Scheduled for CT scan of abdomen and pelvis.  He'll return to see me in one week, sooner if the vomiting does not immediately resolved.

## 2011-02-07 ENCOUNTER — Other Ambulatory Visit (INDEPENDENT_AMBULATORY_CARE_PROVIDER_SITE_OTHER): Payer: Self-pay | Admitting: General Surgery

## 2011-02-07 ENCOUNTER — Emergency Department (HOSPITAL_COMMUNITY): Payer: Medicaid Other

## 2011-02-07 ENCOUNTER — Inpatient Hospital Stay (HOSPITAL_COMMUNITY)
Admission: EM | Admit: 2011-02-07 | Discharge: 2011-02-12 | DRG: 299 | Disposition: A | Discharge status: Home / Self Care | Payer: Medicaid Other | Attending: General Surgery | Admitting: General Surgery

## 2011-02-07 ENCOUNTER — Ambulatory Visit
Admission: RE | Admit: 2011-02-07 | Discharge: 2011-02-07 | Disposition: A | Discharge status: Home / Self Care | Payer: Medicaid Other | Source: Ambulatory Visit | Attending: General Surgery | Admitting: General Surgery

## 2011-02-07 DIAGNOSIS — Z9049 Acquired absence of other specified parts of digestive tract: Secondary | ICD-10-CM

## 2011-02-07 DIAGNOSIS — J449 Chronic obstructive pulmonary disease, unspecified: Secondary | ICD-10-CM | POA: Diagnosis present

## 2011-02-07 DIAGNOSIS — Y838 Other surgical procedures as the cause of abnormal reaction of the patient, or of later complication, without mention of misadventure at the time of the procedure: Secondary | ICD-10-CM | POA: Diagnosis present

## 2011-02-07 DIAGNOSIS — K560 Paralytic ileus: Secondary | ICD-10-CM | POA: Diagnosis present

## 2011-02-07 DIAGNOSIS — D126 Benign neoplasm of colon, unspecified: Secondary | ICD-10-CM | POA: Diagnosis present

## 2011-02-07 DIAGNOSIS — F17200 Nicotine dependence, unspecified, uncomplicated: Secondary | ICD-10-CM | POA: Diagnosis present

## 2011-02-07 DIAGNOSIS — I998 Other disorder of circulatory system: Principal | ICD-10-CM | POA: Diagnosis present

## 2011-02-07 DIAGNOSIS — I81 Portal vein thrombosis: Secondary | ICD-10-CM | POA: Diagnosis present

## 2011-02-07 DIAGNOSIS — R112 Nausea with vomiting, unspecified: Secondary | ICD-10-CM | POA: Diagnosis present

## 2011-02-07 DIAGNOSIS — J4489 Other specified chronic obstructive pulmonary disease: Secondary | ICD-10-CM | POA: Diagnosis present

## 2011-02-07 DIAGNOSIS — D6859 Other primary thrombophilia: Secondary | ICD-10-CM

## 2011-02-07 LAB — COMPREHENSIVE METABOLIC PANEL
ALT: 43 U/L (ref 0–53)
AST: 24 U/L (ref 0–37)
Alkaline Phosphatase: 95 U/L (ref 39–117)
BUN: 12 mg/dL (ref 6–23)
Calcium: 10 mg/dL (ref 8.4–10.5)
Creat: 1.06 mg/dL (ref 0.50–1.35)
GFR calc Af Amer: >60 mL/min (ref >60)
Glucose, Bld: 100 mg/dL — ABNORMAL HIGH (ref 70–99)
Glucose, Bld: 135 mg/dL — ABNORMAL HIGH (ref 70–99)
Sodium: 136 mEq/L (ref 135–145)
Total Bilirubin: 0.6 mg/dL (ref 0.3–1.2)
Total Protein: 8.7 g/dL — ABNORMAL HIGH (ref 6.0–8.3)

## 2011-02-07 LAB — DIFFERENTIAL
Basophils Relative: 0 % (ref 0–1)
Lymphocytes Relative: 7 % — ABNORMAL LOW (ref 12–46)
Lymphs Abs: 0.7 10*3/uL (ref 0.7–4.0)
Monocytes Relative: 3 % (ref 3–12)
Neutro Abs: 8.5 10*3/uL — ABNORMAL HIGH (ref 1.7–7.7)
Neutrophils Relative %: 89 % — ABNORMAL HIGH (ref 43–77)

## 2011-02-07 LAB — CBC WITH DIFFERENTIAL/PLATELET
Basophils Relative: 1 % (ref 0–1)
Eosinophils Absolute: 0.2 10*3/uL (ref 0.0–0.7)
HCT: 48.7 % (ref 39.0–52.0)
Hemoglobin: 16.8 g/dL (ref 13.0–17.0)
MCH: 31.3 pg (ref 26.0–34.0)
MCHC: 34.5 g/dL (ref 30.0–36.0)
Monocytes Absolute: 1 10*3/uL (ref 0.1–1.0)
Monocytes Relative: 8 % (ref 3–12)
RDW: 12.1 % (ref 11.5–15.5)

## 2011-02-07 LAB — PROTIME-INR: Prothrombin Time: 13.9 seconds (ref 11.6–15.2)

## 2011-02-07 LAB — CBC
Hemoglobin: 16.7 g/dL (ref 13.0–17.0)
MCH: 31.7 pg (ref 26.0–34.0)
RBC: 5.27 MIL/uL (ref 4.22–5.81)

## 2011-02-07 LAB — APTT: aPTT: 31 seconds (ref 24–37)

## 2011-02-07 MED ORDER — IOHEXOL 300 MG/ML  SOLN: 100.0000 mL | Freq: Once | INTRAMUSCULAR | Status: DC | PRN

## 2011-02-07 MED ORDER — IOHEXOL 300 MG/ML  SOLN
80.0000 mL | Freq: Once | INTRAMUSCULAR | Status: AC | PRN
  Administered 2011-02-07: 80 mL via INTRAVENOUS

## 2011-02-08 ENCOUNTER — Other Ambulatory Visit (HOSPITAL_COMMUNITY): Payer: Medicaid Other

## 2011-02-08 DIAGNOSIS — M79609 Pain in unspecified limb: Secondary | ICD-10-CM

## 2011-02-08 DIAGNOSIS — M7989 Other specified soft tissue disorders: Secondary | ICD-10-CM

## 2011-02-08 LAB — BASIC METABOLIC PANEL
CO2: 25 mEq/L (ref 19–32)
Calcium: 8.8 mg/dL (ref 8.4–10.5)
GFR calc Af Amer: >60 mL/min (ref >60)
GFR calc non Af Amer: >60 mL/min (ref >60)
Sodium: 135 mEq/L (ref 135–145)

## 2011-02-08 LAB — HEPARIN LEVEL (UNFRACTIONATED)
Heparin Unfractionated: 0.26 IU/mL — ABNORMAL LOW (ref 0.30–0.70)
Heparin Unfractionated: 0.49 IU/mL (ref 0.30–0.70)
Heparin Unfractionated: 0.49 IU/mL (ref 0.30–0.70)

## 2011-02-08 LAB — CBC
HCT: 39.7 % (ref 39.0–52.0)
Platelets: 500 10*3/uL — ABNORMAL HIGH (ref 150–400)
RDW: 11.6 % (ref 11.5–15.5)
WBC: 11.5 10*3/uL — ABNORMAL HIGH (ref 4.0–10.5)

## 2011-02-08 LAB — PROTEIN C, TOTAL: Protein C, Total: 102 % (ref 72–160)

## 2011-02-08 LAB — PROTEIN S, TOTAL: Protein S Ag, Total: 126 % (ref 60–150)

## 2011-02-08 LAB — HOMOCYSTEINE: Homocysteine: 8.6 umol/L (ref 4.0–15.4)

## 2011-02-08 LAB — LUPUS ANTICOAGULANT PANEL
Lupus Anticoagulant: NOT DETECTED
dRVVT Incubated 1:1 Mix: 38.9 secs (ref 34.1–42.2)

## 2011-02-08 LAB — ANTITHROMBIN III: AntiThromb III Func: 122 % — ABNORMAL HIGH (ref 76–126)

## 2011-02-08 LAB — PROTEIN C ACTIVITY: Protein C Activity: 190 % — ABNORMAL HIGH (ref 75–133)

## 2011-02-08 NOTE — H&P (Signed)
[name], [name] NO.:  [id number]  MEDICAL Carrillo NO.:  [id number]  LOCATION:  1527                         FACILITY:  [facility]  PHYSICIAN:  Angelia Carrillo. [name], M.D.DATE OF BIRTH:  08/10/1955  DATE OF ADMISSION:  02/07/2011 DATE OF DISCHARGE:                             HISTORY & PHYSICAL   CHIEF COMPLAINT:  Abdominal pain, nausea, and vomiting.  HISTORY:  This is [name] who underwent elective extended left colectomy with stapled coloproctostomy on January 24, 2011. This was done for non-familial polyposis syndrome.  Final pathology report showed multiple adenomas of the left colon, but no cancer.  He recovered uneventfully and was discharged home.  He did well with toleration of diet and resumption of loose bowel movements and flatus until day before yesterday when he began having multiple episodes of vomiting.  He denied fever or chills.  He continued to have some loose stools, which were reduced in volume, but continued to pass lots of flatus. He noted intermittant abdominal pain.  I saw him in the office yesterday and he did not look acutely ill, but was having some abdominal tenderness and the cause of this was unclear.  Lab work yesterday showed a white blood cell count of 12,000, normal BUN and creatinine, and borderline elevated lipase.  A CT scan of the abdomen and pelvis was performed today.  This shows that there is a sub-occlusive thrombus within the anterior branch of the right portal vein.  There are possible tiny perfusion defects in the lateral spleen, but this was felt to be equivocal.  The distal colonic anastomosis looked normal without any associated fluid collection or air around it, suggesting that there was no evidence of anastomotic leak.  He seemed to have a small bowel ileus.  After reviewing the CT scan, I directed the patient to [facility] for admission and management of his portal vein  thrombosis.  Since I saw him in the office yesterday, he has continued to pass lots of flatus, although his stool volume is small and liquid.  He has vomited once or twice.  He says he actually feels much better this evening.  PAST MEDICAL HISTORY: 1. Extend left colectomy, January 24, 2011. 2. Sigmoid colectomy in [place], [place] for polyposis, in 1999. 3. Chronic obstructive pulmonary disease. 4. Tobacco abuse. 5. History of alcohol abuse. 6. Fibromyalgia by history.  CURRENT MEDICATIONS:  Phenergan.  Vicodin.  Protonix.  DRUG ALLERGIES:  IVP DYE causes hives, although the CT scanning at this morning resulted in no reaction whatsoever.  FAMILY HISTORY:  Mother had hypertension, diabetes, and breast cancer. Sister had breast cancer.  Sister had hypertension.  Maternal grandfather had stomach cancer.  SOCIAL HISTORY:  He continues to smoke.  He has not really been drinking alcohol for the past 2 or 3 weeks.  REVIEW OF SYSTEMS:  12-system review of systems is performed and is negative except as described above.  PHYSICAL EXAMINATION:  GENERAL:  [name], cooperative, alert, [name], in minimal distress. VITAL SIGNS:  Temperature 97.5, blood pressure 143/83, pulse 69, respirations 20. EYES:  Sclerae clear.  Extraocular movements intact. EARS, MOUTH, THROAT:  Nose,  lips, tongue, and oropharynx are without gross lesions. NECK:  Supple, nontender.  No mass.  No jugular distention.  No tracheal deviation. HEART:  Regular rate and rhythm.  No murmur.  Radial and femoral pulses are easily palpable.  No tachycardia. ABDOMEN:  Recent midline incision appears to be healing without signs of any infection or drainage.  The abdomen is relatively soft with just a little bit of incisional tenderness.  Bowel sounds are active.  He might be slightly distended, but not much.  There is no mass.  There are no peritoneal signs. GENITOURINARY:  There is no inguinal mass or  adenopathy. EXTREMITIES:  He moves all 4 extremities well without pain or deformity. SKIN:  Warm and dry.  No peripheral edema. NEUROLOGIC:  No gross motor or sensory deficits.  Mood and affect are normal.  ADMISSION DATA:  CT scan is described above.  Chest x-ray is normal.  CT scan of the chest to rule out pulmonary embolism is pending.  Complete metabolic panel is normal with exception of glucose of 135 and a total protein of 8.7.  Prothrombin time 13.9, INR 1.05, PTT 31.  CBC shows hemoglobin 16.7, white blood cell count 9500 with a left shift. Hypercoagulation panel is pending.  IMPRESSION: 1. Nonocclusive portal vein thrombosis, acute.  This may be due to     recent abdominal surgery and dehydration.  Rule out hypercoagulable     state.  I think this is the cause of his abdominal pain, nausea,     and vomiting. 2. Recent extended left colectomy and coloproctostomy for non-familial     polyposis.  At this point in time, there is no evidence of deep     space infection or anastomotic leak. 3. Small bowel ileus secondary to portal vein thrombosis. 4. Tobacco abuse. 5. History of alcohol abuse. 6. Chronic obstructive pulmonary disease.  PLAN: 1. The patient will be admitted for bowel rest, IV hydration, and     further evaluation. 2. We have ordered hypercoagulation panel for evaluation. 3. Dr. Arlan Carrillo has been asked to see the patient to evaluate     for hypercoagulable state. 4. He will be started on antibiotics. 5. He will be started on heparin drip and Coumadin. 6. He will be placed on proton pump inhibitors. 7. We will obtain a CT angiogram of the chest to rule out pulmonary     embolism.     Angelia Carrillo. [name], M.D.     HMI/MEDQ  D:  02/07/2011  T:  02/08/2011  Job:  [id number]  cc:   Arthur Greener, MD Fax: [phone]  [name]. Arthur Dice, MD,FACG [address] [place] [address]  [name], M.D. Fax: [phone]  Electronically Signed by  [name] M.D. on 02/08/2011 06:25:03 AM

## 2011-02-09 LAB — CBC
MCH: 30.9 pg (ref 26.0–34.0)
MCHC: 34.6 g/dL (ref 30.0–36.0)
MCV: 89.3 fL (ref 78.0–100.0)
Platelets: 436 10*3/uL — ABNORMAL HIGH (ref 150–400)

## 2011-02-09 LAB — HEPARIN LEVEL (UNFRACTIONATED): Heparin Unfractionated: 0.64 IU/mL (ref 0.30–0.70)

## 2011-02-09 LAB — CARDIOLIPIN ANTIBODIES, IGG, IGM, IGA
Anticardiolipin IgA: 14 APL U/mL (ref <22)
Anticardiolipin IgG: 7 GPL U/mL — ABNORMAL LOW (ref <23)
Anticardiolipin IgM: 7 MPL U/mL — ABNORMAL LOW (ref <11)

## 2011-02-10 LAB — CBC
MCH: 30.6 pg (ref 26.0–34.0)
Platelets: 433 10*3/uL — ABNORMAL HIGH (ref 150–400)
RBC: 4.48 MIL/uL (ref 4.22–5.81)
RDW: 11.7 % (ref 11.5–15.5)
WBC: 6.1 10*3/uL (ref 4.0–10.5)

## 2011-02-10 LAB — HEPARIN LEVEL (UNFRACTIONATED)
Heparin Unfractionated: 0.72 IU/mL — ABNORMAL HIGH (ref 0.30–0.70)
Heparin Unfractionated: 0.75 IU/mL — ABNORMAL HIGH (ref 0.30–0.70)

## 2011-02-10 LAB — PROTIME-INR: Prothrombin Time: 23.8 seconds — ABNORMAL HIGH (ref 11.6–15.2)

## 2011-02-11 LAB — CBC
MCH: 31 pg (ref 26.0–34.0)
MCHC: 35.1 g/dL (ref 30.0–36.0)
Platelets: 434 10*3/uL — ABNORMAL HIGH (ref 150–400)
RDW: 11.7 % (ref 11.5–15.5)

## 2011-02-11 LAB — PROTIME-INR: Prothrombin Time: 24.9 seconds — ABNORMAL HIGH (ref 11.6–15.2)

## 2011-02-12 ENCOUNTER — Telehealth (INDEPENDENT_AMBULATORY_CARE_PROVIDER_SITE_OTHER): Payer: Self-pay

## 2011-02-12 LAB — CBC
HCT: 40.4 % (ref 39.0–52.0)
Hemoglobin: 14.1 g/dL (ref 13.0–17.0)
WBC: 7.2 10*3/uL (ref 4.0–10.5)

## 2011-02-12 LAB — PROTIME-INR: INR: 2.38 — ABNORMAL HIGH (ref 0.00–1.49)

## 2011-02-12 NOTE — Telephone Encounter (Signed)
Per Dr [name] I called pt to ck on appt status [id number] labs this week. Pt stated he was told by d/c nurse  to call Dr Lonell Face office to set up appt.. Pt states he was unable to get appt with Dr Dalene Carrow. I called Brey at Dr Lonell Face office re:pt f/u labs for his coumadin level. I was advised I would have to speak with new pt coord [id number] f/u for this pt. I spoke with Amy at Dr Lonell Face office re: above appt and its importance. Amy states she will discuss this with Dr Dalene Carrow 9-11 in am and call with time pt will need to be seen and have labs. She assured me that pt will be seen this week to have coumadin/ blood levels checked. Dr Murlean Caller advised.

## 2011-02-13 ENCOUNTER — Encounter: Payer: Medicaid Other | Admitting: Hematology and Oncology

## 2011-02-13 ENCOUNTER — Telehealth (INDEPENDENT_AMBULATORY_CARE_PROVIDER_SITE_OTHER): Payer: Self-pay

## 2011-02-13 NOTE — Telephone Encounter (Signed)
I have spoken with pt and he has not heard from Dr Lonell Face office for f/u coumadin. I called Amy at Dr Lonell Face office and got her vm. I left detailed msg [id number] for appt and to call me.

## 2011-02-14 ENCOUNTER — Encounter (INDEPENDENT_AMBULATORY_CARE_PROVIDER_SITE_OTHER): Payer: Medicaid Other | Admitting: General Surgery

## 2011-02-14 ENCOUNTER — Telehealth (INDEPENDENT_AMBULATORY_CARE_PROVIDER_SITE_OTHER): Payer: Self-pay

## 2011-02-14 NOTE — Discharge Summary (Signed)
NAMEMAXAMUS, COLAO NO.:  [id number]  MEDICAL RECORD NO.:  [id number]  LOCATION:  1533                         FACILITY:  [facility]  PHYSICIAN:  [name]. [name], M.D.DATE OF BIRTH:  [birth year]  DATE OF ADMISSION:  01/24/2011 DATE OF DISCHARGE:  01/30/2011                              DISCHARGE SUMMARY   FINAL DIAGNOSES: 1. Nonfamilial colonic polyposis syndrome. 2. Past history of sigmoid colectomy for colon polyps in [place],     [place]. 3. Tobacco abuse. 4. History of alcohol abuse.  OPERATION PERFORMED:  Extended left colectomy, coloproctostomy, takedown of splenic flexure, appendectomy; date of surgery January 24, 2011.  HISTORY:  This is a 55 year old Caucasian man who had a colon resection at [facility] in [place], [place] in 1996 for colonic polyps with dysplasia.  He has been having recurrent polyps, having been followed by Dr. Roosvelt Harps and more recently by Dr.Robert [name].  Recent colonoscopy by Dr. [name] showed a few polyps in the right colon and transverse colon, which were removed with extensive polyps of the splenic flexure down into the rectosigmoid.  Biopsy showed tubular adenoma.  Dr. [name] referred him for consideration of left colectomy because of the extensive nature of the polyps.  He was counseled as an outpatient and he agreed to this.  He underwent a bowel prep at home and was brought to the hospital and the operating room electively.  HOSPITAL COURSE:  On the day of admission, the patient underwent an extended left colectomy.  We took down the splenic flexure and created an anastomosis between the midtransverse colon and the proximal rectum using an EEA stapler.  The surgery went well.  Final pathology report showed multiple tubular adenomas and hyperplastic polyps, but no malignancy.  The appendix was normal.  Postoperatively, the patient did fairly well.  He was maintained on indirect  protocol and was given Lovenox for venous thromboembolism prophylaxis.  Pulmonary toilet maneuvers were instituted because of his history of smoking and COPD.  He had an ileus for a couple of days and then he began a liquid diet.  Foley catheter was removed on postop day #2 and he voided uneventfully.  He had a bowel movement on August 26 and we advanced his diet thereafter.  He was discharged on August 28.  At that time, he was tolerating a regular diet, had had several bowel movements.  The abdomen was soft. The midline incision seemed to be healing well.  The Telfa wicks had been removed.  There was no sign of infection.  Jackson-Pratt drain had odorless clear fluid and the Jackson-Pratt drain was removed.  He was given a prescription for Vicodin for pain.  He was to continue to take his Prilosec 20 mg a day and hyoscyamine 0.125 mg twice a day for his GI symptoms.  He was asked to return to see me in the office in 1 week to get his staples out.  Diet and activities were discussed and written in the discharge form.     [name]. [name], M.D.     HMI/MEDQ  D:  02/14/2011  T:  02/14/2011  Job:  [id number]  cc:   Britt Bottom  Bruna Potter, MD Fax: [phone]  [name]. [name], MD,FACG [address] [place] [address]  Electronically Signed by Claud Kelp M.D. on 02/14/2011 04:56:48 PM

## 2011-02-14 NOTE — Telephone Encounter (Signed)
I spoke with pt to verify appt with Dr Dalene Carrow for coumadin check. Pt states they called him and set up appt and blood check for 02-16-11 with Dr Dalene Carrow.

## 2011-02-16 ENCOUNTER — Other Ambulatory Visit: Payer: Self-pay | Admitting: Hematology and Oncology

## 2011-02-16 ENCOUNTER — Encounter (HOSPITAL_BASED_OUTPATIENT_CLINIC_OR_DEPARTMENT_OTHER): Payer: Medicaid Other | Admitting: Hematology and Oncology

## 2011-02-16 DIAGNOSIS — I81 Portal vein thrombosis: Secondary | ICD-10-CM

## 2011-02-16 LAB — PROTIME-INR

## 2011-02-16 LAB — CBC WITH DIFFERENTIAL/PLATELET
BASO%: 1 % (ref 0.0–2.0)
Eosinophils Absolute: 0.5 10*3/uL (ref 0.0–0.5)
MCV: 91.8 fL (ref 79.3–98.0)
MONO#: 0.7 10*3/uL (ref 0.1–0.9)
MONO%: 10.6 % (ref 0.0–14.0)
NEUT#: 4 10*3/uL (ref 1.5–6.5)
RBC: 4.71 10*6/uL (ref 4.20–5.82)
RDW: 12.3 % (ref 11.0–14.6)
WBC: 6.7 10*3/uL (ref 4.0–10.3)

## 2011-02-16 LAB — BASIC METABOLIC PANEL
Calcium: 9.1 mg/dL (ref 8.4–10.5)
Chloride: 105 mEq/L (ref 96–112)
Creatinine, Ser: 0.99 mg/dL (ref 0.50–1.35)

## 2011-02-16 LAB — PROTHROMBIN TIME
INR: 4.42 — ABNORMAL HIGH (ref <1.50)
Prothrombin Time: 42.8 seconds — ABNORMAL HIGH (ref 11.6–15.2)

## 2011-02-19 ENCOUNTER — Encounter (HOSPITAL_BASED_OUTPATIENT_CLINIC_OR_DEPARTMENT_OTHER): Payer: Medicaid Other | Admitting: Hematology and Oncology

## 2011-02-19 ENCOUNTER — Other Ambulatory Visit: Payer: Self-pay | Admitting: Hematology and Oncology

## 2011-02-19 DIAGNOSIS — I81 Portal vein thrombosis: Secondary | ICD-10-CM

## 2011-02-19 DIAGNOSIS — Z5181 Encounter for therapeutic drug level monitoring: Secondary | ICD-10-CM

## 2011-02-19 DIAGNOSIS — Z7901 Long term (current) use of anticoagulants: Secondary | ICD-10-CM

## 2011-02-19 LAB — PROTIME-INR
INR: 2.7 (ref 2.00–3.50)
Protime: 32.4 Seconds — ABNORMAL HIGH (ref 10.6–13.4)

## 2011-02-24 NOTE — Discharge Summary (Signed)
NAMERAUNEL, DIMARTINO NO.:  [id number]  MEDICAL RECORD NO.:  [id number]  LOCATION:  1527                         FACILITY:  [facility]  PHYSICIAN:  [name]. [name], M.D.DATE OF BIRTH:  Mar 07, 1956  DATE OF ADMISSION:  02/07/2011 DATE OF DISCHARGE:  02/12/2011                              DISCHARGE SUMMARY   FINAL DIAGNOSES: 1. Nonocclusive portal vein thrombosis. 2. Nonfamilial colon polyposis syndrome, status post left colectomy on     January 24, 2011, with recent small bowel ileus secondary to portal     vein thrombosis, resolved. 3. Tobacco abuse. 4. History of alcohol abuse. 5. Chronic obstructive pulmonary disease.  OPERATION PERFORMED:  None.  HISTORY:  This is [name] who underwent elective extended left colectomy and staple to coloproctostomy on January 24, 2011, for nonfamilial polyposis syndrome.  Final pathology report showed multiple adenomas of the left colon, but no cancer.  He recovered uneventfully.  He presented back to the office on the day of readmission with nausea and vomiting and abdominal pain.  He is continued to have stools, but have been reduced in volume.  He continued to pass lots of flatus.  Lab work showed a white count of 12,000.  A CT scan of the abdomen and pelvis was performed and this showed that there is a subocclusive thrombus within the anterior branch of the right portal vein.  There were possible tiny perfusion defects in the lateral spleen but this was equivocal.  The colonic anastomosis looked normal without any associated fluid collection or air around, suggesting uneventful healing.  There did appear to be a mild small bowel ileus.  The patient was directly admitted to the hospital for anticoagulation and further evaluation.  HOSPITAL COURSE:  On the day of admission, the patient was started on IV hydration and a heparin drip and was begun on Coumadin.  He was also placed empirically on  antibiotics.  A hypercoagulation panel was performed which ultimately turned out to be mostly negative.  The patient was seen in consultation by Dr. Arlan Organ who supervised the hypercoagulation workup.  She recommended that he be discharged home on Coumadin.  A CT angiogram of the chest was negative for pulmonary embolism and lower extremity Dopplers were negative for deep venous thrombosis of the lower extremities.  He improved daily thereafter.  On September 6th, he was feeling better, but still having a little bit of cramps, but no further vomiting, was passing flatus.  We allowed sips of clear liquids at that point in time. On September 7th, he continued to feel better.  By September 19th, he was doing well with no GI complaints and diet had been advanced.  The patient was discharged home on September 10th.  At that time, his physical exam was unremarkable.  His white blood cell count was 7200.  His INR was 2.38.  Pharmacy recommended that he be discharged on Coumadin 5 mg a day and he was to check PT/INR in the Coumadin clinic at Dr. Lonell Face office on September 12th.  He was asked to return to see me in 3 weeks.     [name]. [name], M.D.  HMI/MEDQ  D:  02/23/2011  T:  02/23/2011  Job:  [id number]  cc:   Clyda Greener, MD Fax: [phone]  [name]. [name], MD,FACG [address] [place] [address]  Laurice Record, M.D. Fax: [phone]  Electronically Signed by Claud Kelp M.D. on 02/24/2011 08:16:17 AM

## 2011-02-26 ENCOUNTER — Encounter (HOSPITAL_BASED_OUTPATIENT_CLINIC_OR_DEPARTMENT_OTHER): Payer: Medicaid Other | Admitting: Hematology and Oncology

## 2011-02-26 ENCOUNTER — Other Ambulatory Visit: Payer: Self-pay | Admitting: Hematology and Oncology

## 2011-02-26 DIAGNOSIS — I81 Portal vein thrombosis: Secondary | ICD-10-CM

## 2011-02-26 LAB — PROTIME-INR

## 2011-02-27 ENCOUNTER — Encounter (INDEPENDENT_AMBULATORY_CARE_PROVIDER_SITE_OTHER): Payer: Medicaid Other | Admitting: General Surgery

## 2011-03-05 ENCOUNTER — Encounter (HOSPITAL_BASED_OUTPATIENT_CLINIC_OR_DEPARTMENT_OTHER): Payer: Medicaid Other | Admitting: Hematology and Oncology

## 2011-03-05 ENCOUNTER — Other Ambulatory Visit: Payer: Self-pay | Admitting: Hematology and Oncology

## 2011-03-05 DIAGNOSIS — I81 Portal vein thrombosis: Secondary | ICD-10-CM

## 2011-03-05 DIAGNOSIS — Z7901 Long term (current) use of anticoagulants: Secondary | ICD-10-CM

## 2011-03-05 LAB — PROTIME-INR: INR: 3.3 (ref 2.00–3.50)

## 2011-03-13 NOTE — Consult Note (Signed)
NAME:  [name], [name] NO.:  [id number]  MEDICAL RECORD NO.:  [id number]  LOCATION:  1527                         FACILITY:  [facility]  PHYSICIAN:  Arlan Organ, M.D.    DATE OF BIRTH:  11/02/1955  DATE OF CONSULTATION:  02/07/2011 DATE OF DISCHARGE:                                CONSULTATION   CONSULTING PHYSICIAN:  Laurice Record, M.D.  REASON FOR CONSULT:  Coagulopathy.  PRIMARY PHYSICIAN:  Clyda Greener, M.D.  REQUESTING PHYSICIAN:  [name]. [name], M.D.  HISTORY OF PRESENT ILLNESS:  [name] is a 55 year old white male with a long history of colon polyps and dysplasia, status post sigmoid colectomy in 1996, with recurrence of polyp, undergoing colonoscopy to remove adenomas with dysplasia.  Most recently, the patient underwent extended left colectomy with takedown of splenic flexure and splenic anastomosis on January 24, 2011, for the non-familial polyposis coli. Surgical pathology revealed tubular adenomas with hyperplastic polyp, without dysplasia or malignancy.  He did fairly well postoperatively. However, on February 06, 2011, the patient was seen in Dr. Jacinto Halim office, was treated for the history of vomiting with diarrhea.  He also complained of pain; however, he was afebrile.  He received a CT scan of the abdomen on February 07, 2011, which revealed a sub-occlusive thrombus within the anterior segmental branch of the right portal vein, felt to reflect emboli.  In addition, defects were seen in the spleen. He denies any family history or personal history of blood clots.  Based on these findings, we were asked to see him in consultation with recommendations.  PAST MEDICAL HISTORY: 1. History of colon polyp as above. 2. History of renal insufficiency. 3. COPD. 4. Fibromyalgia. 5. Hearing loss.  PAST SURGICAL HISTORY: 1. Status post left finger amputation. 2. Status post left colectomy with coloproctostomy with stapler,     takedown of  splenic flexure and splenic anastomosis, January 24, 2011, Dr. [name]. 3. Status post appendectomy, January 24, 2011.  ALLERGIES:  IV CONTRAST.  MEDICATIONS:  Morphine sulfate, Zofran, Invanz, heparin.  REVIEW OF SYSTEMS:  Remarkable for dyspnea on exertion, shortness of breath, fatigue, abdominal pain with bloating, nausea, vomiting.  In addition, he has bilateral leg cramping, but no bilateral lower extremity edema.  No recent trips.  He denies any fever, chills, night sweats, headaches, mental status changes, vision changes, dysphagia, productive cough, chest pain or palpitations.  No failure to thrive or decreased appetite.  No weight loss.  No blood in the stools or in the urine.  No gum or nose bleeds.  No hemoptysis.  No quinine provokes.  No significant amount of caffeine.  He does smoke tobacco for at least 40 years.  Rest of the review of systems is negative.  FAMILY HISTORY:  Remarkable for mother dying with breast cancer, grandfather had stomach cancer.  He has two sisters with a history of breast cancer.  SOCIAL HISTORY:  The patient is single.  He has one child, age 52, in good health.  Lives in [place].  Full code.  The patient is a Curator.  He smokes about 10 cigarettes a day for at least 40 years. No alcohol  or recreational drug use.  PHYSICAL EXAM:  GENERAL:  This is a thin 55 year old white male, in no acute distress.  Alert and oriented x3. VITAL SIGNS:  Blood pressure 125/83, pulse 62, respirations 18, temperature 98, saturation 96% in room air. HEENT:  Normocephalic, atraumatic.  Sclerae anicteric.  Oral cavity without thrush or lesions. NECK:  Supple.  No cervical or supraclavicular masses. LUNGS:  Clear to auscultation and percussion. CARDIOVASCULAR:  Regular rate and rhythm without murmurs, rubs or gallops. ABDOMEN:  Soft, but slightly tender, no rebound tenderness.  Bowel sounds x4.  No hepatosplenomegaly. EXTREMITIES:  Without edema.  No calf  tenderness.  LABS:  White count 9.5, hemoglobin 16.7, hematocrit 46.2, platelets 559. Sodium 137, potassium 3.9, chloride 103, CO2 of 31, BUN 6, creatinine 0.7, glucose 131, PT 13.9, INR 1.05.  IMPRESSION AND PLAN: 1. Portal vein thrombosis, likely secondary from recent surgery, but     need to rule out hypercoagulable state.  We will check a     hypercoagulable panel prior to anticoagulation with IV heparin,     which will subsequently bridged to Coumadin.  Review of systems is also     pertinent with dyspnea on exertion, in a setting of history of     chronic obstructive pulmonary disease.  We need to rule out     pulmonary embolism.  Obtain a CT angiogram and Dopplers of the lower     extremities, to rule out deep vein thrombosis. 2. Status post left extended hemicolectomy for non-familial polyposis     coli. 3. Chronic obstructive pulmonary disease, encourage the patient to     discontinue smoking.  Thank you very much for allowing [place] the opportunity to participate in the care of [name].  Once the results of the hypercoagulable panel return, we will proceed with further recommendations based on the results.     Marlowe Kays, PA-C   Arlan Organ, MD     SW/MEDQ  D:  02/08/2011  T:  02/08/2011  Job:  [id number]  cc:   Clyda Greener, MD Fax: [phone]  Electronically Signed by Marlowe Kays P.A. on 02/09/2011 03:12:14 PM Electronically Signed by Arlan Organ M.D. on 03/13/2011 01:24:51 PM

## 2011-03-19 ENCOUNTER — Encounter (HOSPITAL_BASED_OUTPATIENT_CLINIC_OR_DEPARTMENT_OTHER): Payer: Medicaid Other | Admitting: Hematology and Oncology

## 2011-03-19 ENCOUNTER — Other Ambulatory Visit: Payer: Self-pay | Admitting: Hematology and Oncology

## 2011-03-19 DIAGNOSIS — Z7901 Long term (current) use of anticoagulants: Secondary | ICD-10-CM

## 2011-03-19 LAB — PROTIME-INR
INR: 1.5 — ABNORMAL LOW (ref 2.00–3.50)
Protime: 18 Seconds — ABNORMAL HIGH (ref 10.6–13.4)

## 2011-03-26 ENCOUNTER — Other Ambulatory Visit: Payer: Self-pay | Admitting: Hematology and Oncology

## 2011-03-26 ENCOUNTER — Encounter (INDEPENDENT_AMBULATORY_CARE_PROVIDER_SITE_OTHER): Payer: Medicaid Other | Admitting: General Surgery

## 2011-03-26 ENCOUNTER — Encounter (HOSPITAL_BASED_OUTPATIENT_CLINIC_OR_DEPARTMENT_OTHER): Payer: Medicaid Other | Admitting: Hematology and Oncology

## 2011-03-26 DIAGNOSIS — I81 Portal vein thrombosis: Secondary | ICD-10-CM

## 2011-03-30 ENCOUNTER — Other Ambulatory Visit: Payer: Self-pay | Admitting: Hematology and Oncology

## 2011-03-30 ENCOUNTER — Encounter (HOSPITAL_BASED_OUTPATIENT_CLINIC_OR_DEPARTMENT_OTHER): Payer: Medicaid Other | Admitting: Hematology and Oncology

## 2011-03-30 DIAGNOSIS — I81 Portal vein thrombosis: Secondary | ICD-10-CM

## 2011-03-30 DIAGNOSIS — I8290 Acute embolism and thrombosis of unspecified vein: Secondary | ICD-10-CM

## 2011-03-30 LAB — CBC WITH DIFFERENTIAL/PLATELET
BASO%: 0.4 % (ref 0.0–2.0)
Basophils Absolute: 0 10*3/uL (ref 0.0–0.1)
EOS%: 6.8 % (ref 0.0–7.0)
HCT: 46.8 % (ref 38.4–49.9)
HGB: 16.2 g/dL (ref 13.0–17.1)
LYMPH%: 22.1 % (ref 14.0–49.0)
MCH: 31.6 pg (ref 27.2–33.4)
MCHC: 34.5 g/dL (ref 32.0–36.0)
MCV: 91.5 fL (ref 79.3–98.0)
NEUT%: 61.9 % (ref 39.0–75.0)
Platelets: 338 10*3/uL (ref 140–400)

## 2011-03-30 LAB — BASIC METABOLIC PANEL
BUN: 8 mg/dL (ref 6–23)
CO2: 27 mEq/L (ref 19–32)
Glucose, Bld: 92 mg/dL (ref 70–99)
Potassium: 4.1 mEq/L (ref 3.5–5.3)
Sodium: 143 mEq/L (ref 135–145)

## 2011-04-04 ENCOUNTER — Other Ambulatory Visit: Payer: Self-pay | Admitting: Hematology and Oncology

## 2011-04-04 ENCOUNTER — Encounter (HOSPITAL_BASED_OUTPATIENT_CLINIC_OR_DEPARTMENT_OTHER): Payer: Medicaid Other | Admitting: Hematology and Oncology

## 2011-04-04 DIAGNOSIS — Z7901 Long term (current) use of anticoagulants: Secondary | ICD-10-CM

## 2011-04-04 DIAGNOSIS — I81 Portal vein thrombosis: Secondary | ICD-10-CM

## 2011-04-10 DIAGNOSIS — I81 Portal vein thrombosis: Secondary | ICD-10-CM | POA: Insufficient documentation

## 2011-04-10 DIAGNOSIS — I82409 Acute embolism and thrombosis of unspecified deep veins of unspecified lower extremity: Secondary | ICD-10-CM | POA: Insufficient documentation

## 2011-04-16 ENCOUNTER — Encounter (INDEPENDENT_AMBULATORY_CARE_PROVIDER_SITE_OTHER): Payer: Medicaid Other | Admitting: General Surgery

## 2011-04-17 ENCOUNTER — Telehealth: Payer: Self-pay | Admitting: Hematology and Oncology

## 2011-04-17 ENCOUNTER — Ambulatory Visit: Payer: Medicaid Other

## 2011-04-17 ENCOUNTER — Other Ambulatory Visit (HOSPITAL_BASED_OUTPATIENT_CLINIC_OR_DEPARTMENT_OTHER): Payer: Medicaid Other

## 2011-04-17 ENCOUNTER — Other Ambulatory Visit: Payer: Self-pay | Admitting: Hematology and Oncology

## 2011-04-17 DIAGNOSIS — D45 Polycythemia vera: Secondary | ICD-10-CM

## 2011-04-17 DIAGNOSIS — I81 Portal vein thrombosis: Secondary | ICD-10-CM

## 2011-04-17 DIAGNOSIS — I82409 Acute embolism and thrombosis of unspecified deep veins of unspecified lower extremity: Secondary | ICD-10-CM

## 2011-04-17 LAB — PROTIME-INR: INR: 1.8 — ABNORMAL LOW (ref 2.00–3.50)

## 2011-04-17 NOTE — Telephone Encounter (Signed)
Added lb/coumadin clinic appt for 12/4 @ 2 pm for lb + 2:15 pm coumadin clinic. appt d/t per coumadin clinic.

## 2011-04-17 NOTE — Progress Notes (Signed)
Pt seeing surgeon for follow-up visit tomorrow.  He has no pain in stomach except gas pain. Marily Lente, Pharm.D.

## 2011-05-08 ENCOUNTER — Other Ambulatory Visit: Payer: Self-pay | Admitting: Hematology and Oncology

## 2011-05-08 ENCOUNTER — Ambulatory Visit: Payer: Medicaid Other

## 2011-05-08 ENCOUNTER — Other Ambulatory Visit (HOSPITAL_BASED_OUTPATIENT_CLINIC_OR_DEPARTMENT_OTHER): Payer: Medicaid Other

## 2011-05-08 DIAGNOSIS — D45 Polycythemia vera: Secondary | ICD-10-CM

## 2011-05-08 DIAGNOSIS — I81 Portal vein thrombosis: Secondary | ICD-10-CM

## 2011-05-08 DIAGNOSIS — I82409 Acute embolism and thrombosis of unspecified deep veins of unspecified lower extremity: Secondary | ICD-10-CM

## 2011-05-08 LAB — PROTIME-INR
INR: 3.3 (ref 2.00–3.50)
Protime: 39.6 Seconds — ABNORMAL HIGH (ref 10.6–13.4)

## 2011-05-15 ENCOUNTER — Other Ambulatory Visit: Payer: Self-pay | Admitting: Pharmacist

## 2011-05-15 DIAGNOSIS — I81 Portal vein thrombosis: Secondary | ICD-10-CM

## 2011-05-21 ENCOUNTER — Encounter (INDEPENDENT_AMBULATORY_CARE_PROVIDER_SITE_OTHER): Payer: Self-pay | Admitting: General Surgery

## 2011-05-21 ENCOUNTER — Ambulatory Visit (INDEPENDENT_AMBULATORY_CARE_PROVIDER_SITE_OTHER): Payer: Medicaid Other | Admitting: General Surgery

## 2011-05-21 VITALS — BP 138/98 | HR 60 | Temp 98.5°F | Resp 12 | Ht 71.0 in | Wt 154.6 lb

## 2011-05-21 DIAGNOSIS — Z9889 Other specified postprocedural states: Secondary | ICD-10-CM

## 2011-05-21 DIAGNOSIS — Z9049 Acquired absence of other specified parts of digestive tract: Secondary | ICD-10-CM

## 2011-05-21 NOTE — Progress Notes (Signed)
Patient ID: [name], male   DOB: [birth year], 55 y.o.   MRN: [id number]  Chief Complaint  Patient presents with  . Routine Post Op    PO reck lt hemicolectomy and proctoscopy 01/24/11    HPI [name] is a 55 y.o. male.  He returns for long-term followup following an extended left colectomy.  This patient underwent elective extended left colectomy and coloproctostomy on January 24, 2011 for non-familial  polyposis syndrome. Final pathology report showed multiple adenomas of the left colon but no cancer.  He was readmitted with abdominal pain nausea and vomiting in September, 2012 and was found to have portal vein thrombosis. Workup for hypercoagulation status was negative. He was placed on Coumadin. Dr. Dalene Carrillo has evaluated his hematologic profiles and is managing his Coumadin as an outpatient. He states that she plans to do a CT scan in January or February to make sure that all of the portal vein thrombosis has resolved.  He states that he is eating one meal a day which is the typical pattern before his surgery. He states that he has lots of flatus, but that is not a new problem, he has been told he had irritable bowel syndrome. He has 2 or 3 solid bowel movements per day. He has no trouble swallowing. Reflux symptoms have resolved. His only complaint is that his abdomen it makes loud gas noises and that he passes a lot of flatus  HPI  Past Medical History  Diagnosis Date  . Hx of colonic polyps     adenomatous polyp; had prior colectomy  . Arthritis   . Emphysema   . GERD (gastroesophageal reflux disease)   . Fibromyalgia   . COPD (chronic obstructive pulmonary disease)   . Depression   . Hyperlipidemia   . IBS (irritable bowel syndrome)   . Hearing loss   . Hypertension   . Fibromyalgia 1996  . Abdominal pain   . Night sweats   . Gum disease   . Diarrhea   . Vomiting   . Nausea     Past Surgical History  Procedure Date  . Colectomy     inguinal rectal  colectomy  . Foot surgery     left  . Nose surgery   . Hand surgery     for left finger amputations  . Hemicolectomy 01/24/11  . Proctoscopy 01/24/11    Family History  Problem Relation Age of Onset  . Hypertension Mother   . Diabetes Mother   . Breast cancer Mother   . Breast cancer Sister     x 2  . Cancer Sister     breast  . Stomach cancer Maternal Grandfather   . Hypertension Sister     x2    Social History History  Substance Use Topics  . Smoking status: Current Everyday Smoker -- 0.5 packs/day    Types: Cigarettes  . Smokeless tobacco: Never Used  . Alcohol Use: 0.0 oz/week    2-4 Cans of beer per week     4 drinks daily    Allergies  Allergen Reactions  . Iohexol Hives    IVP CONTRAST    Current Outpatient Prescriptions  Medication Sig Dispense Refill  . warfarin (COUMADIN) 2 MG tablet Take by mouth. Take 4 mg daily and 2mg  on Monday        Current Facility-Administered Medications  Medication Dose Route Frequency Provider Last Rate Last Dose  . DISCONTD: 0.9 %  sodium chloride infusion  500  mL Intravenous Continuous Arthur Meckel, MD        Review of Systems Review of Systems 10 system review of systems is performed and is negative except as described above   Blood pressure 138/98, pulse 60, temperature 98.5 F (36.9 C), temperature source Temporal, resp. rate 12, height 5\' 11"  (1.803 m), weight 154 lb 9.6 oz (70.126 kg).  Physical Exam Physical Exam  Constitutional:    Thin alert pleasant cooperative. Odor of tobacco. Neck:        no adenopathy no mass. Lungs:      Clear to auscultation bilaterally. Abdomen: Soft. Nontender. Midline wound well healed. No mass. No hernia  Assessment    Nonfamilial colon polyposis syndrome, status post left colectomy January 24, 2011.  Nonocclusive portal vein thrombosis, post op, on Coumadin  Tobacco abuse  Past history of alcohol abuse  Chronic of cardiopulmonary disease.    Plan    He was advised  to continue close followup with Dr. Dalene Carrillo regarding his Coumadin dose and follow up CT scanning of his portal vein thrombosis.  In terms of his IBS , he was advised to try probiotics, Beano, and avoid dairy products.  In terms of his  polyposis syndrome, he was advised to get a colonoscopy with Dr. Melvia Carrillo in August 2013.  Return to see me p.r.n. further surgical problems.       [name] M 05/21/2011, 12:08 PM

## 2011-05-21 NOTE — Patient Instructions (Signed)
You have recovered from the abdominal surgery and colon resection. You developed blood clots in your portal vein postop, and it is very important that you stay on the blood thinners and keep all of your appointments with Dr. Dalene Carrow. I agree that you should have a CT scan next year to make her the clots are all gone, and Dr. Dalene Carrow will arrange for that. Do not stop the blood thinners until she tells you that you may stop it.  You should have another colonoscopy in August of 2013, and you should call Dr. Melvia Heaps to set that up.  Return to see me if there are any further surgical problems.

## 2011-05-23 ENCOUNTER — Other Ambulatory Visit (HOSPITAL_BASED_OUTPATIENT_CLINIC_OR_DEPARTMENT_OTHER): Payer: Medicaid Other

## 2011-05-23 ENCOUNTER — Ambulatory Visit: Payer: Medicaid Other

## 2011-05-23 ENCOUNTER — Ambulatory Visit (HOSPITAL_BASED_OUTPATIENT_CLINIC_OR_DEPARTMENT_OTHER): Payer: Medicaid Other | Admitting: Pharmacist

## 2011-05-23 DIAGNOSIS — I81 Portal vein thrombosis: Secondary | ICD-10-CM

## 2011-05-23 DIAGNOSIS — I82409 Acute embolism and thrombosis of unspecified deep veins of unspecified lower extremity: Secondary | ICD-10-CM

## 2011-05-23 LAB — PROTIME-INR: INR: 2.2 (ref 2.00–3.50)

## 2011-05-23 NOTE — Progress Notes (Signed)
INR therapeutic.  Started new OTC probiotic per MD recommendations.  No problems.  Will continue current dose, and recheck INR in 3 weeks.

## 2011-06-14 ENCOUNTER — Ambulatory Visit: Payer: Medicaid Other

## 2011-06-14 ENCOUNTER — Other Ambulatory Visit (HOSPITAL_BASED_OUTPATIENT_CLINIC_OR_DEPARTMENT_OTHER): Payer: Medicaid Other

## 2011-06-14 ENCOUNTER — Ambulatory Visit: Payer: Self-pay | Admitting: Pharmacist

## 2011-06-14 DIAGNOSIS — I81 Portal vein thrombosis: Secondary | ICD-10-CM

## 2011-06-14 DIAGNOSIS — I82409 Acute embolism and thrombosis of unspecified deep veins of unspecified lower extremity: Secondary | ICD-10-CM

## 2011-06-14 LAB — POCT INR: INR: 2.2

## 2011-06-14 LAB — PROTIME-INR: Protime: 32.4 Seconds — ABNORMAL HIGH (ref 10.6–13.4)

## 2011-06-20 ENCOUNTER — Ambulatory Visit: Payer: Medicaid Other | Admitting: Gastroenterology

## 2011-06-20 ENCOUNTER — Encounter: Payer: Self-pay | Admitting: *Deleted

## 2011-06-22 ENCOUNTER — Ambulatory Visit: Payer: Medicaid Other | Admitting: Gastroenterology

## 2011-07-12 ENCOUNTER — Ambulatory Visit (HOSPITAL_BASED_OUTPATIENT_CLINIC_OR_DEPARTMENT_OTHER): Payer: Medicaid Other | Admitting: Pharmacist

## 2011-07-12 ENCOUNTER — Other Ambulatory Visit: Payer: Medicaid Other

## 2011-07-12 DIAGNOSIS — I81 Portal vein thrombosis: Secondary | ICD-10-CM

## 2011-07-12 DIAGNOSIS — I82409 Acute embolism and thrombosis of unspecified deep veins of unspecified lower extremity: Secondary | ICD-10-CM

## 2011-07-12 LAB — PROTIME-INR

## 2011-07-12 LAB — POCT INR: INR: 3.1

## 2011-07-12 NOTE — Progress Notes (Signed)
INR slightly supratherapeutic (3.1).  No problems with bleeding or bruising.  No complaints.  Did ask for information on Behavioral Health, since he is looking for someone to manage his depression.  He has not been started on any new medications.  Since his last couple of INRs have been on the higher side, will decrease his dose slightly to 4mg  daily except 2mg  on Mon & Thurs.  Will recheck INR in 1 month.

## 2011-08-09 ENCOUNTER — Ambulatory Visit (HOSPITAL_BASED_OUTPATIENT_CLINIC_OR_DEPARTMENT_OTHER): Payer: Medicaid Other | Admitting: Pharmacist

## 2011-08-09 ENCOUNTER — Other Ambulatory Visit (HOSPITAL_BASED_OUTPATIENT_CLINIC_OR_DEPARTMENT_OTHER): Payer: Medicaid Other

## 2011-08-09 DIAGNOSIS — I81 Portal vein thrombosis: Secondary | ICD-10-CM

## 2011-08-09 DIAGNOSIS — I82409 Acute embolism and thrombosis of unspecified deep veins of unspecified lower extremity: Secondary | ICD-10-CM

## 2011-08-09 LAB — PROTIME-INR
INR: 2.5 (ref 2.00–3.50)
Protime: 30 Seconds — ABNORMAL HIGH (ref 10.6–13.4)

## 2011-08-09 NOTE — Progress Notes (Signed)
INR therapeutic after slight decrease in dose last visit to 4mg  daily except 2mg  on MTh.  No changes in medications.  No problems.  Will continue current dose, and recheck INR with existing MD appt on 08/29/11.  Pt will go for a repeat US on 08/24/11.  Pt plans to ask about the results of his [place], and whether he can stop the Coumadin with Dr. Dalene Carrow during that visit.

## 2011-08-14 ENCOUNTER — Other Ambulatory Visit: Payer: Self-pay | Admitting: Pharmacist

## 2011-08-14 DIAGNOSIS — I82409 Acute embolism and thrombosis of unspecified deep veins of unspecified lower extremity: Secondary | ICD-10-CM

## 2011-08-14 DIAGNOSIS — I81 Portal vein thrombosis: Secondary | ICD-10-CM

## 2011-08-15 ENCOUNTER — Telehealth: Payer: Self-pay | Admitting: Pharmacist

## 2011-08-24 ENCOUNTER — Ambulatory Visit (HOSPITAL_COMMUNITY)
Admission: RE | Admit: 2011-08-24 | Discharge: 2011-08-24 | Disposition: A | Discharge status: Home / Self Care | Payer: Medicaid Other | Source: Ambulatory Visit | Attending: Hematology and Oncology | Admitting: Hematology and Oncology

## 2011-08-24 DIAGNOSIS — R112 Nausea with vomiting, unspecified: Secondary | ICD-10-CM | POA: Insufficient documentation

## 2011-08-24 DIAGNOSIS — Z86718 Personal history of other venous thrombosis and embolism: Secondary | ICD-10-CM | POA: Insufficient documentation

## 2011-08-24 DIAGNOSIS — I8290 Acute embolism and thrombosis of unspecified vein: Secondary | ICD-10-CM

## 2011-08-24 DIAGNOSIS — K219 Gastro-esophageal reflux disease without esophagitis: Secondary | ICD-10-CM | POA: Insufficient documentation

## 2011-08-24 DIAGNOSIS — K7689 Other specified diseases of liver: Secondary | ICD-10-CM | POA: Insufficient documentation

## 2011-08-29 ENCOUNTER — Telehealth: Payer: Self-pay | Admitting: Hematology and Oncology

## 2011-08-29 ENCOUNTER — Ambulatory Visit (HOSPITAL_BASED_OUTPATIENT_CLINIC_OR_DEPARTMENT_OTHER): Payer: Medicaid Other | Admitting: Hematology and Oncology

## 2011-08-29 ENCOUNTER — Encounter: Payer: Self-pay | Admitting: *Deleted

## 2011-08-29 ENCOUNTER — Encounter: Payer: Self-pay | Admitting: Hematology and Oncology

## 2011-08-29 ENCOUNTER — Encounter: Payer: Self-pay | Admitting: Gastroenterology

## 2011-08-29 ENCOUNTER — Other Ambulatory Visit: Payer: Medicaid Other

## 2011-08-29 ENCOUNTER — Ambulatory Visit: Payer: Medicaid Other

## 2011-08-29 VITALS — BP 192/76 | HR 60 | Temp 97.6°F | Ht 71.0 in | Wt 158.1 lb

## 2011-08-29 DIAGNOSIS — R1012 Left upper quadrant pain: Secondary | ICD-10-CM

## 2011-08-29 DIAGNOSIS — K912 Postsurgical malabsorption, not elsewhere classified: Secondary | ICD-10-CM

## 2011-08-29 DIAGNOSIS — I82409 Acute embolism and thrombosis of unspecified deep veins of unspecified lower extremity: Secondary | ICD-10-CM

## 2011-08-29 DIAGNOSIS — I81 Portal vein thrombosis: Secondary | ICD-10-CM

## 2011-08-29 DIAGNOSIS — Z7901 Long term (current) use of anticoagulants: Secondary | ICD-10-CM

## 2011-08-29 LAB — CBC WITH DIFFERENTIAL/PLATELET
BASO%: 0.6 % (ref 0.0–2.0)
EOS%: 4.7 % (ref 0.0–7.0)
HCT: 47.1 % (ref 38.4–49.9)
LYMPH%: 21.9 % (ref 14.0–49.0)
MCH: 31.7 pg (ref 27.2–33.4)
MCHC: 36.3 g/dL — ABNORMAL HIGH (ref 32.0–36.0)
MCV: 87.2 fL (ref 79.3–98.0)
MONO#: 0.8 10*3/uL (ref 0.1–0.9)
MONO%: 11 % (ref 0.0–14.0)
NEUT%: 61.8 % (ref 39.0–75.0)
Platelets: 349 10*3/uL (ref 140–400)
RBC: 5.4 10*6/uL (ref 4.20–5.82)
nRBC: 0 % (ref 0–0)

## 2011-08-29 LAB — PROTIME-INR: Protime: 31.2 Seconds — ABNORMAL HIGH (ref 10.6–13.4)

## 2011-08-29 NOTE — Patient Instructions (Signed)
Patient to follow up as instructed.   Current Outpatient Prescriptions  Medication Sig Dispense Refill  . warfarin (COUMADIN) 2 MG tablet Take by mouth. Take 4 mg daily except 2mg  on Monday and Thursday            March 2013  Sunday Monday Tuesday Wednesday Thursday Friday Saturday                  1   2    3   4   5   6   7    LAB MO     2:00 PM  (15 min.)  Alvina Filbert  [facility]   COUMADIN CLINIC 15   2:15 PM  (15 min.)  Wilmer Floor, [place]  [facility] 8   9    10   11   12   13   14   15   16    17   18   19   20   21   22    US ABDOMEN COMPLETE   9:00 AM  (30 min.)  [facility]  [place] [facility] 23    24   25   26   27    LAB MO     2:15 PM  (15 min.)  Alvina Filbert  [facility]   EST PT 30   2:45 PM  (30 min.)  Laurice Record, MD  [facility] 28   29   LAB MO     3:00 PM  (15 min.)  Windell Hummingbird  [facility]   COUMADIN CLINIC 15   3:15 PM  (15 min.)  [facility] 30    31

## 2011-08-29 NOTE — Telephone Encounter (Signed)
gv pt appt for oct2013.  scheduled pt for ct for 04/01  @ [facility]. schedule pt for appt with Dr. [name] for 05/01

## 2011-08-29 NOTE — Progress Notes (Signed)
CC:   Arthur Greener, MD [name]. Arthur Dice, MD,FACG [name]. [name], M.D.  IDENTIFYING STATEMENT:  The patient is a 56 year old man with a history of portal vein thrombosis who presents for followup.  INTERVAL HISTORY:  [name] was placed on anticoagulation when he presented on February 07, 2011 with abdominal pain following a previous colectomy.   He has a history of nonfamilial polyposis and he was status post left colectomy, coloproctostomy, takedown of splenic flexure and appendicostomy.  He has undergone a hypercoagulable workup that had shown elevated beta-2 glycoprotein IgM and IgA.  There is no family history for thrombosis.  [name] has been on 6 months of anticoagulation and his INR has been within the parameters.  He received an ultrasound of the abdomen and pelvis on 08/24/2011 that showed that the portal vein and branches were patent without clots.  There is a stable  1.7 cm cyst in the liver. [name] notes ongoing left upper quadrant pain, apparently worse 30 minutes after eating.  He  is also experiencing short-bowel syndrome.  He notes that lately he has been losing weight.  MEDICATIONS:  Reviewed and updated.  Past medical history, family history and social history are unchanged.  REVIEW OF SYSTEMS:  Besides left upper quadrant discomfort with diarrhea, he has no other complaints.  PHYSICAL EXAM:  Patient is alert and oriented x3.  Vitals:  Pulse was 87, blood pressure 134/85, temperature 97.6, respirations 20, weight 158 (165).  HEENT:  Head is atraumatic, normocephalic.  Sclerae anicteric. Mouth moist.  Chest:  Clear.  CVS:  Unremarkable.  Abdomen:  Soft, nontender.  Bowel sounds present.  Extremities:  No edema.  LAB DATA:  08/29/2011, white cell count 7.2, hemoglobin 17.1, hematocrit 47.1, platelets 349.  PT 31.2, INR 2.6.  Results of the ultrasound as in interval history.  IMPRESSION AND PLAN:  [name] is a 56 year old man with:-  1. Portal vein  thrombosis following recent colectomy for nonfamilial colonic polyposis syndrome on January 25, 2011.  He was placed on anticoagulation for 6 months.  He has completed 6 months.  Ultrasound shows no evidence of  thrombosis.  I have scheduled an abdominal CT scan.  If there is no  evidence of malignancy or thrombosis, he will discontinue coumadin.  I will also have him follow up with Dr. Melvia Carrillo for medical GI management.  If his CT scans indicate no pathology, I will have him discontinue anticoagulation.  He follows up routinely in 4-5 months' time. 2.  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  Refer to Dr.  Arlyce Carrillo.    ______________________________ Laurice Record, M.D. LIO/MEDQ  D:  08/29/2011  T:  08/29/2011  Job:  [id number]

## 2011-08-29 NOTE — Progress Notes (Signed)
This office note has been dictated.

## 2011-08-30 ENCOUNTER — Telehealth: Payer: Self-pay | Admitting: Nurse Practitioner

## 2011-08-30 ENCOUNTER — Other Ambulatory Visit: Payer: Self-pay | Admitting: Nurse Practitioner

## 2011-08-30 DIAGNOSIS — I81 Portal vein thrombosis: Secondary | ICD-10-CM

## 2011-08-30 NOTE — Telephone Encounter (Signed)
Spoke with Deidre and Cordelia Pen in radiology.  Anti allergy meds will be called in by them to pt pharmacy.  Pt CT appt moved to next Friday at 4/5,  Labs here at 1400 and scan at 1500.  Spoke with patient- gave information and instructed to pick up CT contrast at radiology.  Pt verbalized understanding.

## 2011-08-31 ENCOUNTER — Telehealth: Payer: Self-pay | Admitting: Pharmacist

## 2011-08-31 ENCOUNTER — Ambulatory Visit: Payer: Medicaid Other

## 2011-08-31 ENCOUNTER — Telehealth: Payer: Self-pay | Admitting: Hematology and Oncology

## 2011-08-31 ENCOUNTER — Other Ambulatory Visit: Payer: Medicaid Other | Admitting: Lab

## 2011-08-31 NOTE — Telephone Encounter (Signed)
Pt has completed 6 months of anticoagulation. He is scheduled for CT scan on 09/07/11. Based on results, MD may discontinue anticoagulation if scans indicate no pathology causing abdominal pain. Dr. Dalene Carrow will let us know of plans with anticoagulation after scans are reviewed.

## 2011-08-31 NOTE — Telephone Encounter (Signed)
Added lb for 4/5 d/t/pt aware per 3/28 pof.

## 2011-09-03 ENCOUNTER — Other Ambulatory Visit (HOSPITAL_COMMUNITY): Payer: Medicaid Other

## 2011-09-05 ENCOUNTER — Telehealth: Payer: Self-pay | Admitting: *Deleted

## 2011-09-05 NOTE — Telephone Encounter (Signed)
Pt. Calls because when he had a bowel movement today he had lots of bright red blood.  His bowels are very loose and he saw a lot of blood in the toilet and when he wiped.  He is on coumadin 4mg  daily.  His last INR on 08/29/11 was 2.6.  He also reports increase in his pain on the left side.  He rates it an 8/10.  He is due to have an abdominal CT this Friday 4/5. Discussed with Dr. Dalene Carrow.  He is to stop his coumadin and go to the ED for the blood in his stools.  Called pt. He has not taken his coumadin yet today and instructed him to stop the coumadin.  Pt. Verbalized understanding and is very willing to go to the ED.

## 2011-09-07 ENCOUNTER — Ambulatory Visit (HOSPITAL_COMMUNITY)
Admission: RE | Admit: 2011-09-07 | Discharge: 2011-09-07 | Disposition: A | Discharge status: Home / Self Care | Payer: Medicaid Other | Source: Ambulatory Visit | Attending: Hematology and Oncology | Admitting: Hematology and Oncology

## 2011-09-07 ENCOUNTER — Other Ambulatory Visit (HOSPITAL_BASED_OUTPATIENT_CLINIC_OR_DEPARTMENT_OTHER): Payer: Medicaid Other

## 2011-09-07 ENCOUNTER — Other Ambulatory Visit (HOSPITAL_COMMUNITY): Payer: Medicaid Other

## 2011-09-07 ENCOUNTER — Ambulatory Visit (HOSPITAL_BASED_OUTPATIENT_CLINIC_OR_DEPARTMENT_OTHER): Payer: Medicaid Other | Admitting: Pharmacist

## 2011-09-07 ENCOUNTER — Other Ambulatory Visit: Payer: Self-pay | Admitting: *Deleted

## 2011-09-07 DIAGNOSIS — I82409 Acute embolism and thrombosis of unspecified deep veins of unspecified lower extremity: Secondary | ICD-10-CM

## 2011-09-07 DIAGNOSIS — Z9089 Acquired absence of other organs: Secondary | ICD-10-CM | POA: Insufficient documentation

## 2011-09-07 DIAGNOSIS — I708 Atherosclerosis of other arteries: Secondary | ICD-10-CM | POA: Insufficient documentation

## 2011-09-07 DIAGNOSIS — I700 Atherosclerosis of aorta: Secondary | ICD-10-CM | POA: Insufficient documentation

## 2011-09-07 DIAGNOSIS — G8929 Other chronic pain: Secondary | ICD-10-CM | POA: Insufficient documentation

## 2011-09-07 DIAGNOSIS — R1012 Left upper quadrant pain: Secondary | ICD-10-CM | POA: Insufficient documentation

## 2011-09-07 DIAGNOSIS — I81 Portal vein thrombosis: Secondary | ICD-10-CM

## 2011-09-07 DIAGNOSIS — N281 Cyst of kidney, acquired: Secondary | ICD-10-CM | POA: Insufficient documentation

## 2011-09-07 DIAGNOSIS — J9819 Other pulmonary collapse: Secondary | ICD-10-CM | POA: Insufficient documentation

## 2011-09-07 DIAGNOSIS — K7689 Other specified diseases of liver: Secondary | ICD-10-CM | POA: Insufficient documentation

## 2011-09-07 DIAGNOSIS — R110 Nausea: Secondary | ICD-10-CM | POA: Insufficient documentation

## 2011-09-07 DIAGNOSIS — Z86718 Personal history of other venous thrombosis and embolism: Secondary | ICD-10-CM | POA: Insufficient documentation

## 2011-09-07 LAB — BASIC METABOLIC PANEL - CANCER CENTER ONLY
CO2: 26 mEq/L (ref 18–33)
Calcium: 8.8 mg/dL (ref 8.0–10.3)
Potassium: 4 mEq/L (ref 3.3–4.7)
Sodium: 141 mEq/L (ref 128–145)

## 2011-09-07 LAB — PROTIME-INR

## 2011-09-07 MED ORDER — IOHEXOL 300 MG/ML  SOLN
100.0000 mL | Freq: Once | INTRAMUSCULAR | Status: AC | PRN
  Administered 2011-09-07: 100 mL via INTRAVENOUS

## 2011-09-07 NOTE — Progress Notes (Signed)
INR result in inBasket from today (09/07/11) = 1.3 Found notes from 09/05/11 that pt had blood in his stool.  Pt had CT scan today to eval his portal vein thrombus & current GI problem. I s/w Dr. Gaylyn Rong (on call) in Dr. Lonell Face absence who stated that Dr. Dalene Carrow will need to assess situation when she is back in the office this Tuesday.  Until then have pt hold his Coumadin. I called pt, who had just returned home from his CT scan & he stated the "bleeding" wasn't blood.  He drank a strawberry banana V8 Fusion drink & that it was the dye in the drink that discolored his stool.  Pt stated that he was told after the CT scan today that nothing was detected on the scan & that there was no more clot present. Pt knows that he will continue to hold the Coumadin until he hears from Dr. Dalene Carrow next week.  He has completed 6 mos of anticoagulation so it is very likely that he will come off the Coumadin anyway.  Again, that will be determined by Dr. Dalene Carrow. I have emailed Dr. Dalene Carrow to notify her that pt is holding his Coumadin, etc. We will follow along for now until we are told the final decision re: anticoag.  If pt comes off Coumadin we will d/c him from our service. Marily Lente, Pharm.D.

## 2011-09-10 ENCOUNTER — Telehealth: Payer: Self-pay | Admitting: Hematology and Oncology

## 2011-09-10 NOTE — Telephone Encounter (Signed)
Informed patient CT scan results and to discontinue coumadin. Encouraged to follow up with Dr [name] or GI if he had recurrent GI symptoms.  Pt voiced his understanding.

## 2011-09-13 ENCOUNTER — Encounter: Payer: Self-pay | Admitting: Pharmacist

## 2011-09-13 NOTE — Progress Notes (Signed)
Pt now off Coumadin.  We will d/c him from our Coumadin clinic at this time. Marily Lente, Pharm.D.

## 2011-10-03 ENCOUNTER — Encounter: Payer: Self-pay | Admitting: Gastroenterology

## 2011-10-03 ENCOUNTER — Ambulatory Visit (INDEPENDENT_AMBULATORY_CARE_PROVIDER_SITE_OTHER): Payer: Medicaid Other | Admitting: Gastroenterology

## 2011-10-03 ENCOUNTER — Other Ambulatory Visit (INDEPENDENT_AMBULATORY_CARE_PROVIDER_SITE_OTHER): Payer: Medicaid Other

## 2011-10-03 DIAGNOSIS — R109 Unspecified abdominal pain: Secondary | ICD-10-CM

## 2011-10-03 DIAGNOSIS — Z86010 Personal history of colonic polyps: Secondary | ICD-10-CM

## 2011-10-03 DIAGNOSIS — R1013 Epigastric pain: Secondary | ICD-10-CM

## 2011-10-03 DIAGNOSIS — K3189 Other diseases of stomach and duodenum: Secondary | ICD-10-CM

## 2011-10-03 LAB — CBC WITH DIFFERENTIAL/PLATELET
Basophils Relative: 0.5 % (ref 0.0–3.0)
Eosinophils Relative: 4.7 % (ref 0.0–5.0)
HCT: 48.4 % (ref 39.0–52.0)
Hemoglobin: 16.6 g/dL (ref 13.0–17.0)
Lymphs Abs: 1.5 10*3/uL (ref 0.7–4.0)
MCV: 92 fl (ref 78.0–100.0)
Monocytes Absolute: 0.9 10*3/uL (ref 0.1–1.0)
Neutro Abs: 5.4 10*3/uL (ref 1.4–7.7)
RBC: 5.26 Mil/uL (ref 4.22–5.81)
WBC: 8.2 10*3/uL (ref 4.5–10.5)

## 2011-10-03 LAB — COMPREHENSIVE METABOLIC PANEL
Alkaline Phosphatase: 86 U/L (ref 39–117)
BUN: 9 mg/dL (ref 6–23)
GFR: 94.04 mL/min (ref >60.00)
Glucose, Bld: 88 mg/dL (ref 70–99)
Total Bilirubin: 0.5 mg/dL (ref 0.3–1.2)

## 2011-10-03 LAB — PROTIME-INR
INR: 1 ratio (ref 0.8–1.0)
Prothrombin Time: 11.1 s (ref 10.2–12.4)

## 2011-10-03 MED ORDER — OMEPRAZOLE 20 MG PO CPDR: 20.0000 mg | DELAYED_RELEASE_CAPSULE | Freq: Every day | ORAL | Status: DC

## 2011-10-03 NOTE — Patient Instructions (Signed)
We are giving you samples of Creon, You will need to take Two a day with every meal  You will need to make a follow up appointment for 3 weeks  You will go to the basement for labs today

## 2011-10-03 NOTE — Assessment & Plan Note (Signed)
Symptoms compatible with nonulcer dyspepsia. He could have malabsorption or maldigestion, especially in view of his heavy alcohol use. It is noteworthy that there is no history of pancreatitis and no calcifications have been described on prior CTs or plain x-rays.  Recommendations #1 empiric trial of pancreatic enzyme supplementation #2 begin omeprazole 20 mg daily #3 check  comprehensive metabolic profile, carotene level, INR and CBC

## 2011-10-03 NOTE — Progress Notes (Signed)
History of Present Illness:  [name] has returned for evaluation of abdominal pain and bloating. He underwent a left hemicolectomy for colonic polyposis in August, 2012. This was complicated by portal vein thrombosis for which he received a six-month course of anticoagulants. These have been discontinued. Since surgery and prior to surgery he has been complainting of postprandial bloating with excess belching and flatus. Stools are intermittently loose. He actually has pain left upper quadrant that may last for hours at a time. He is a heavy alcohol user but there is no history of pancreatitis or hepatitis.  Weight has been stable.    Review of Systems: Pertinent positive and negative review of systems were noted in the above HPI section. All other review of systems were otherwise negative.    Current Medications, Allergies, Past Medical History, Past Surgical History, Family History and Social History were reviewed in [employer] electronic medical record  Vital signs were reviewed in today's medical record. Physical Exam: General: he is a thin male in no acute distress Head: Normocephalic and atraumatic Eyes:  sclerae anicteric, EOMI Ears: Normal auditory acuity Mouth: No deformity or lesions Lungs: Clear throughout to auscultation Heart: Regular rate and rhythm; no murmurs, rubs or bruits Abdomen: Soft, non tender and non distended. No masses, hepatosplenomegaly or hernias noted. Normal Bowel sounds Rectal:deferred Musculoskeletal: Symmetrical with no gross deformities  Pulses:  Normal pulses noted Extremities: No clubbing, cyanosis, edema or deformities noted Neurological: Alert oriented x 4, grossly nonfocal Psychological:  Alert and cooperative. Normal mood and affect

## 2011-10-03 NOTE — Assessment & Plan Note (Signed)
Plan followup colonoscopy July or August 2013

## 2011-11-16 ENCOUNTER — Ambulatory Visit: Payer: Medicaid Other | Admitting: Gastroenterology

## 2012-03-12 ENCOUNTER — Ambulatory Visit: Payer: Medicaid Other | Admitting: Family

## 2012-03-24 ENCOUNTER — Telehealth: Payer: Self-pay | Admitting: *Deleted

## 2012-03-24 NOTE — Telephone Encounter (Signed)
Left voice message to inform the patient of the new date an time  Also mailed out calendar to inform the patient of the new date and time

## 2012-04-15 ENCOUNTER — Ambulatory Visit: Payer: Medicaid Other | Admitting: Family

## 2012-04-15 ENCOUNTER — Other Ambulatory Visit: Payer: Medicaid Other | Admitting: Lab

## 2012-04-21 ENCOUNTER — Emergency Department (HOSPITAL_COMMUNITY): Payer: Self-pay

## 2012-04-21 ENCOUNTER — Encounter (HOSPITAL_COMMUNITY): Payer: Self-pay | Admitting: Emergency Medicine

## 2012-04-21 ENCOUNTER — Emergency Department (HOSPITAL_COMMUNITY)
Admission: EM | Admit: 2012-04-21 | Discharge: 2012-04-21 | Disposition: A | Discharge status: Home / Self Care | Payer: Self-pay | Attending: Emergency Medicine | Admitting: Emergency Medicine

## 2012-04-21 DIAGNOSIS — Z860100 Personal history of colon polyps, unspecified: Secondary | ICD-10-CM | POA: Insufficient documentation

## 2012-04-21 DIAGNOSIS — Z79899 Other long term (current) drug therapy: Secondary | ICD-10-CM | POA: Insufficient documentation

## 2012-04-21 DIAGNOSIS — J449 Chronic obstructive pulmonary disease, unspecified: Secondary | ICD-10-CM | POA: Insufficient documentation

## 2012-04-21 DIAGNOSIS — F17200 Nicotine dependence, unspecified, uncomplicated: Secondary | ICD-10-CM | POA: Insufficient documentation

## 2012-04-21 DIAGNOSIS — S298XXA Other specified injuries of thorax, initial encounter: Secondary | ICD-10-CM | POA: Insufficient documentation

## 2012-04-21 DIAGNOSIS — F3289 Other specified depressive episodes: Secondary | ICD-10-CM | POA: Insufficient documentation

## 2012-04-21 DIAGNOSIS — IMO0001 Reserved for inherently not codable concepts without codable children: Secondary | ICD-10-CM | POA: Insufficient documentation

## 2012-04-21 DIAGNOSIS — S59919A Unspecified injury of unspecified forearm, initial encounter: Secondary | ICD-10-CM | POA: Insufficient documentation

## 2012-04-21 DIAGNOSIS — F329 Major depressive disorder, single episode, unspecified: Secondary | ICD-10-CM | POA: Insufficient documentation

## 2012-04-21 DIAGNOSIS — Y9389 Activity, other specified: Secondary | ICD-10-CM | POA: Insufficient documentation

## 2012-04-21 DIAGNOSIS — S6990XA Unspecified injury of unspecified wrist, hand and finger(s), initial encounter: Secondary | ICD-10-CM | POA: Insufficient documentation

## 2012-04-21 DIAGNOSIS — R197 Diarrhea, unspecified: Secondary | ICD-10-CM | POA: Insufficient documentation

## 2012-04-21 DIAGNOSIS — H9190 Unspecified hearing loss, unspecified ear: Secondary | ICD-10-CM | POA: Insufficient documentation

## 2012-04-21 DIAGNOSIS — M129 Arthropathy, unspecified: Secondary | ICD-10-CM | POA: Insufficient documentation

## 2012-04-21 DIAGNOSIS — S299XXA Unspecified injury of thorax, initial encounter: Secondary | ICD-10-CM

## 2012-04-21 DIAGNOSIS — S59909A Unspecified injury of unspecified elbow, initial encounter: Secondary | ICD-10-CM | POA: Insufficient documentation

## 2012-04-21 DIAGNOSIS — J4489 Other specified chronic obstructive pulmonary disease: Secondary | ICD-10-CM | POA: Insufficient documentation

## 2012-04-21 DIAGNOSIS — E785 Hyperlipidemia, unspecified: Secondary | ICD-10-CM | POA: Insufficient documentation

## 2012-04-21 DIAGNOSIS — Y929 Unspecified place or not applicable: Secondary | ICD-10-CM | POA: Insufficient documentation

## 2012-04-21 DIAGNOSIS — W11XXXA Fall on and from ladder, initial encounter: Secondary | ICD-10-CM | POA: Insufficient documentation

## 2012-04-21 DIAGNOSIS — K219 Gastro-esophageal reflux disease without esophagitis: Secondary | ICD-10-CM | POA: Insufficient documentation

## 2012-04-21 DIAGNOSIS — I10 Essential (primary) hypertension: Secondary | ICD-10-CM | POA: Insufficient documentation

## 2012-04-21 DIAGNOSIS — W19XXXA Unspecified fall, initial encounter: Secondary | ICD-10-CM

## 2012-04-21 MED ORDER — IBUPROFEN 600 MG PO TABS: 600.0000 mg | ORAL_TABLET | Freq: Four times a day (QID) | ORAL | Status: DC | PRN

## 2012-04-21 NOTE — ED Notes (Signed)
Pt presenting to ed with c/o fall off of ladder approximately 81ft. Pt states he's having right side rib pain. Pt denies chest pain. Pt states pain is worse with deep breath. Pt states he woke up this morning with worsening pain. Pt denies nausea and vomiting at this time

## 2012-04-21 NOTE — ED Provider Notes (Signed)
History     CSN: [id number]  Arrival date & time 04/21/12  1054   First MD Initiated Contact with Patient 04/21/12 1135      Chief Complaint  Patient presents with  . Fall    (Consider location/radiation/quality/duration/timing/severity/associated sxs/prior treatment) HPI  Patient presents to the emergency department for evaluation after a fall. Yesterday he fell off an 8 foot ladder around 4 pm while cleaning gutters landing on his left side. He denies hitting his head or injuring his neck. He was able to get up after the incident and went inside. He stayed up until around 9 PM that night and never had any grogginess, confusion, dizziness or neurological symptoms. He comes in complaining of left elbow pain and left rib pain. He does admit that with a deep breath he gets increased rib pain. He is not on any blood thinners. He has a past medical history of emphysema, GERD, fibromyalgia, IBS, hypertension and alcoholism. Patient is in no acute distress his vital signs are stable I see no obvious deformities during history of present illness.  Past Medical History  Diagnosis Date  . Hx of colonic polyps 11/30/2010    tubular adenomas; had prior colectomy  . Arthritis   . Emphysema   . GERD (gastroesophageal reflux disease)   . Fibromyalgia   . COPD (chronic obstructive pulmonary disease)   . Depression   . Hyperlipidemia   . IBS (irritable bowel syndrome)   . Hearing loss   . Hypertension   . Fibromyalgia 1996  . Abdominal pain   . Night sweats   . Gum disease   . Diarrhea   . Vomiting   . Nausea   . Hx of blood clots   . Alcoholism     Past Surgical History  Procedure Date  . Colectomy     inguinal rectal colectomy  . Foot surgery     left  . Nose surgery   . Hand surgery     for left finger amputations  . Hemicolectomy 01/24/11  . Proctoscopy 01/24/11  . Appendectomy     Family History  Problem Relation Age of Onset  . Hypertension Mother   . Diabetes Mother    . Breast cancer Mother   . Breast cancer Sister   . Stomach cancer Maternal Grandfather   . Hypertension Sister   . Hypertension Sister   . Colon cancer Neg Hx     History  Substance Use Topics  . Smoking status: Current Every Day Smoker -- 1.0 packs/day    Types: Cigarettes  . Smokeless tobacco: Never Used  . Alcohol Use: No     Comment: 4 drinks daily-------------Stopped 09-03-2011      Review of Systems  Review of Systems  Gen: no weight loss, fevers, chills, night sweats  Mouth: no dental pain, no sore throat  Neck: no neck pain  Lungs:No wheezing, coughing or hemoptysis CV: no chest pain, palpitations, dependent edema or orthopnea  Abd: no abdominal pain, nausea, vomiting  GU: no dysuria or gross hematuria  MSK:  Left rib and elbow pain Neuro: no headache, no focal neurologic deficits  Skin: no abnormalities Psyche: negative.   Allergies  Iohexol  Home Medications   Current Outpatient Rx  Name  Route  Sig  Dispense  Refill  . OMEPRAZOLE 20 MG PO CPDR   Oral   Take 20 mg by mouth daily.         . IBUPROFEN 600 MG PO TABS  Oral   Take 1 tablet (600 mg total) by mouth every 6 (six) hours as needed for pain.   30 tablet   0     BP 138/79  Pulse 86  Temp 98 F (36.7 C) (Oral)  Resp 14  SpO2 100%  Physical Exam  Nursing note and vitals reviewed. Constitutional: He is oriented to person, place, and time. He appears well-developed and well-nourished. No distress.  HENT:  Head: Normocephalic and atraumatic.  Eyes: Pupils are equal, round, and reactive to light.  Neck: Normal range of motion. Neck supple.  Cardiovascular: Normal rate and regular rhythm.   Pulmonary/Chest: Effort normal. No respiratory distress. He has no wheezes. He exhibits tenderness. He exhibits no laceration, no crepitus, no deformity and no retraction.  Abdominal: Soft.    Musculoskeletal:       Left forearm: He exhibits tenderness and bony tenderness. He exhibits no  swelling, no edema, no deformity and no laceration.  Neurological: He is alert and oriented to person, place, and time. He has normal strength. He displays no atrophy. No sensory deficit. He exhibits normal muscle tone.  Skin: Skin is warm and dry.    ED Course  Procedures (including critical care time)  Labs Reviewed - No data to display Dg Ribs Unilateral W/chest Left  04/21/2012  *RADIOLOGY REPORT*  Clinical Data: Fall with anterior lower left rib pain.  LEFT RIBS AND CHEST - 3+ VIEW  Comparison: CT chest and chest radiograph 02/07/2011.  Findings: Frontal view of the chest shows midline trachea and normal heart size.  Biapical pleural parenchymal scarring.  Lungs are hyperinflated but otherwise clear.  No pleural fluid.  No pneumothorax.  Dedicated views of the left ribs show no fracture.  IMPRESSION: No acute findings.   Original Report Authenticated By: Leanna Battles, M.D.    Dg Elbow Complete Left  04/21/2012  *RADIOLOGY REPORT*  Clinical Data: Fall with left elbow pain.  LEFT ELBOW - COMPLETE 3+ VIEW  Comparison: None.  Findings: No joint effusion or fracture.  Mild degenerative change in the elbow joint.  IMPRESSION: No joint effusion or fracture.  Mild degenerative change in the elbow joint.   Original Report Authenticated By: Leanna Battles, M.D.      1. Fall   2. Rib injury   3. Elbow injury       MDM  xrays are negative. Pt declined pain medication. Pulse ox is 100 percent on room air. No concerning physical exam findings or findings noted on xrays. Will tell pt he can take Tylenol and Ibuprofen for pain and give Ortho referral incase pain persists. No head/neck injury.   Discussed result with patient and he is relieved and ready to go home.  Pt has been advised of the symptoms that warrant their return to the ED. Patient has voiced understanding and has agreed to follow-up with the PCP or specialist.      Dorthula Matas, PA 04/21/12 1244

## 2012-04-21 NOTE — ED Notes (Signed)
Pt states yesterday he was cleaning his gutters out when he fell off his ladder about 8 ft down and the ladder fell on top of him, pt states "I had the wind knocked out of me, I laid there for about 15 minutes". Pt complaining of L sided rib pain, denies pain anywhere else. Pt denies LOC or hitting his head. Denies n/v.

## 2012-04-27 NOTE — ED Provider Notes (Signed)
History/physical exam/procedure(s) were performed by non-physician practitioner and as supervising physician I was immediately available for consultation/collaboration. I have reviewed all notes and am in agreement with care and plan.   Rokia Bosket S Suheyb Raucci, MD 04/27/12 1903 

## 2012-08-25 ENCOUNTER — Emergency Department (HOSPITAL_COMMUNITY): Payer: Medicare Other

## 2012-08-25 ENCOUNTER — Encounter (HOSPITAL_COMMUNITY): Payer: Self-pay | Admitting: Nurse Practitioner

## 2012-08-25 ENCOUNTER — Emergency Department (HOSPITAL_COMMUNITY)
Admission: EM | Admit: 2012-08-25 | Discharge: 2012-08-25 | Disposition: A | Discharge status: Home / Self Care | Payer: Medicare Other | Attending: Emergency Medicine | Admitting: Emergency Medicine

## 2012-08-25 DIAGNOSIS — Z8669 Personal history of other diseases of the nervous system and sense organs: Secondary | ICD-10-CM | POA: Insufficient documentation

## 2012-08-25 DIAGNOSIS — Z8659 Personal history of other mental and behavioral disorders: Secondary | ICD-10-CM | POA: Insufficient documentation

## 2012-08-25 DIAGNOSIS — R109 Unspecified abdominal pain: Secondary | ICD-10-CM | POA: Insufficient documentation

## 2012-08-25 DIAGNOSIS — Z8739 Personal history of other diseases of the musculoskeletal system and connective tissue: Secondary | ICD-10-CM | POA: Insufficient documentation

## 2012-08-25 DIAGNOSIS — H9190 Unspecified hearing loss, unspecified ear: Secondary | ICD-10-CM | POA: Insufficient documentation

## 2012-08-25 DIAGNOSIS — I10 Essential (primary) hypertension: Secondary | ICD-10-CM | POA: Insufficient documentation

## 2012-08-25 DIAGNOSIS — Z860100 Personal history of colon polyps, unspecified: Secondary | ICD-10-CM | POA: Insufficient documentation

## 2012-08-25 DIAGNOSIS — F17200 Nicotine dependence, unspecified, uncomplicated: Secondary | ICD-10-CM | POA: Insufficient documentation

## 2012-08-25 DIAGNOSIS — Z862 Personal history of diseases of the blood and blood-forming organs and certain disorders involving the immune mechanism: Secondary | ICD-10-CM | POA: Insufficient documentation

## 2012-08-25 DIAGNOSIS — Z79899 Other long term (current) drug therapy: Secondary | ICD-10-CM | POA: Insufficient documentation

## 2012-08-25 DIAGNOSIS — Z86718 Personal history of other venous thrombosis and embolism: Secondary | ICD-10-CM | POA: Insufficient documentation

## 2012-08-25 DIAGNOSIS — Z8719 Personal history of other diseases of the digestive system: Secondary | ICD-10-CM | POA: Insufficient documentation

## 2012-08-25 DIAGNOSIS — Z9089 Acquired absence of other organs: Secondary | ICD-10-CM | POA: Insufficient documentation

## 2012-08-25 DIAGNOSIS — F1020 Alcohol dependence, uncomplicated: Secondary | ICD-10-CM | POA: Insufficient documentation

## 2012-08-25 DIAGNOSIS — Z8709 Personal history of other diseases of the respiratory system: Secondary | ICD-10-CM | POA: Insufficient documentation

## 2012-08-25 DIAGNOSIS — Z8639 Personal history of other endocrine, nutritional and metabolic disease: Secondary | ICD-10-CM | POA: Insufficient documentation

## 2012-08-25 DIAGNOSIS — J441 Chronic obstructive pulmonary disease with (acute) exacerbation: Secondary | ICD-10-CM | POA: Insufficient documentation

## 2012-08-25 LAB — CBC
HCT: 47.2 % (ref 39.0–52.0)
MCH: 32 pg (ref 26.0–34.0)
MCHC: 35.2 g/dL (ref 30.0–36.0)
MCV: 91.1 fL (ref 78.0–100.0)
RDW: 11.9 % (ref 11.5–15.5)

## 2012-08-25 LAB — COMPREHENSIVE METABOLIC PANEL
ALT: 37 U/L (ref 0–53)
AST: 31 U/L (ref 0–37)
Alkaline Phosphatase: 84 U/L (ref 39–117)
BUN: 7 mg/dL (ref 6–23)
Calcium: 9.2 mg/dL (ref 8.4–10.5)
Creatinine, Ser: 0.94 mg/dL (ref 0.50–1.35)
GFR calc Af Amer: >90 mL/min (ref >90)
GFR calc non Af Amer: >90 mL/min (ref >90)
Total Bilirubin: 0.6 mg/dL (ref 0.3–1.2)

## 2012-08-25 MED ORDER — HYDROCODONE-ACETAMINOPHEN 5-325 MG PO TABS: 1.0000 | ORAL_TABLET | ORAL | Status: DC | PRN

## 2012-08-25 MED ORDER — MORPHINE SULFATE 4 MG/ML IJ SOLN
6.0000 mg | Freq: Once | INTRAMUSCULAR | Status: AC
  Administered 2012-08-25: 6 mg via INTRAVENOUS
  Filled 2012-08-25: qty 2

## 2012-08-25 MED ORDER — ONDANSETRON 8 MG PO TBDP: 8.0000 mg | ORAL_TABLET | Freq: Three times a day (TID) | ORAL | Status: DC | PRN

## 2012-08-25 NOTE — ED Provider Notes (Signed)
History     CSN: [id number]  Arrival date & time 08/25/12  1036   First MD Initiated Contact with Patient 08/25/12 1045      Chief Complaint  Patient presents with  . Abdominal Pain  . Shortness of Breath  . Headache     The history is provided by the patient and medical records.   patient reports increasing left-sided abdominal pain over the past week.  Yesterday it became worse and began to throb.  He denies nausea vomiting and diarrhea.  No melena or hematochezia.  His had a partial colectomy performed before in the past.  He reports that he has a pulmonary and wasn't one point and at that time his symptoms showed up with some left-sided abdominal tenderness and therefore this has him partially concerned.  He reports no productive cough.  He does have a history of COPD.  He also has a history of fibromyalgia depression gastroesophageal reflux disease and a history of alcoholism.  He's also had pancreatitis before in the past.  He continues to smoke cigarettes and alcohol.  His symptoms are mild to moderate in severity.  Nothing improves or worsens the symptoms  Past Medical History  Diagnosis Date  . Hx of colonic polyps 11/30/2010    tubular adenomas; had prior colectomy  . Arthritis   . Emphysema   . GERD (gastroesophageal reflux disease)   . Fibromyalgia   . COPD (chronic obstructive pulmonary disease)   . Depression   . Hyperlipidemia   . IBS (irritable bowel syndrome)   . Hearing loss   . Hypertension   . Fibromyalgia 1996  . Abdominal pain   . Night sweats   . Gum disease   . Diarrhea   . Vomiting   . Nausea   . Hx of blood clots   . Alcoholism     Past Surgical History  Procedure Laterality Date  . Colectomy      inguinal rectal colectomy  . Foot surgery      left  . Nose surgery    . Hand surgery      for left finger amputations  . Hemicolectomy  01/24/11  . Proctoscopy  01/24/11  . Appendectomy      Family History  Problem Relation Age of Onset  .  Hypertension Mother   . Diabetes Mother   . Breast cancer Mother   . Breast cancer Sister   . Stomach cancer Maternal Grandfather   . Hypertension Sister   . Hypertension Sister   . Colon cancer Neg Hx     History  Substance Use Topics  . Smoking status: Current Every Day Smoker -- 1.00 packs/day    Types: Cigarettes  . Smokeless tobacco: Never Used  . Alcohol Use: 0.0 oz/week     Comment: 3-4 beers daily      Review of Systems  Respiratory: Positive for shortness of breath.   Gastrointestinal: Positive for abdominal pain.  Neurological: Positive for headaches.  All other systems reviewed and are negative.    Allergies  Iohexol  Home Medications   Current Outpatient Rx  Name  Route  Sig  Dispense  Refill  . lipase/protease/amylase (CREON-10/PANCREASE) 12000 UNITS CPEP   Oral   Take 1 capsule by mouth 3 (three) times daily with meals.         Marland Kitchen HYDROcodone-acetaminophen (NORCO/VICODIN) 5-325 MG per tablet   Oral   Take 1 tablet by mouth every 4 (four) hours as needed for pain.  15 tablet   0   . ondansetron (ZOFRAN ODT) 8 MG disintegrating tablet   Oral   Take 1 tablet (8 mg total) by mouth every 8 (eight) hours as needed for nausea.   10 tablet   0     BP 132/92  Pulse 73  Temp(Src) 98.7 F (37.1 C) (Oral)  Resp 14  SpO2 99%  Physical Exam  Nursing note and vitals reviewed. Constitutional: He is oriented to person, place, and time. He appears well-developed and well-nourished.  HENT:  Head: Normocephalic and atraumatic.  Eyes: EOM are normal.  Neck: Normal range of motion.  Cardiovascular: Normal rate, regular rhythm, normal heart sounds and intact distal pulses.   Pulmonary/Chest: Effort normal and breath sounds normal. No respiratory distress.  Abdominal: Soft. He exhibits no distension.  Mild left-sided abdominal tenderness without guarding or rebound  Musculoskeletal: Normal range of motion.  Neurological: He is alert and oriented to  person, place, and time.  Skin: Skin is warm and dry.  Psychiatric: He has a normal mood and affect. Judgment normal.    ED Course  Procedures (including critical care time)   Date: 08/25/2012  Rate: 82  Rhythm: normal sinus rhythm  QRS Axis: normal  Intervals: normal  ST/T Wave abnormalities: normal  Conduction Disutrbances: none  Narrative Interpretation:   Old EKG Reviewed: No significant changes noted     Labs Reviewed  CBC - Abnormal; Notable for the following:    Platelets 416 (*)    All other components within normal limits  COMPREHENSIVE METABOLIC PANEL - Abnormal; Notable for the following:    Glucose, Bld 103 (*)    All other components within normal limits  LIPASE, BLOOD  D-DIMER, QUANTITATIVE   Ct Abdomen Pelvis Wo Contrast  08/25/2012  *RADIOLOGY REPORT*  Clinical Data: Left lower quadrant pain and nausea.  Constipation. Low grade fever.  CT ABDOMEN AND PELVIS WITHOUT CONTRAST  Technique:  Multidetector CT imaging of the abdomen and pelvis was performed following the standard protocol without intravenous contrast.  Comparison: 09/07/2011  Findings: 13 mm cyst the gallbladder fossa is stable.  Otherwise liver and spleen have normal uninfused features.  The stomach, duodenum, pancreas, gallbladder, and adrenal glands are unremarkable.  No evidence for stones in either kidney.  No hydronephrosis or secondary change in either kidney.  No ureteral or bladder stones.  No abdominal aortic aneurysm.  There is no free fluid or lymphadenopathy in the abdomen.  Imaging through the pelvis shows no free fluid.  No pelvic sidewall lymphadenopathy.  The prostate gland is unremarkable.  Suture line visualized in the sigmoid colon consistent with a site of anastomosis.  The terminal ileum is normal.  Nonvisualization of the appendix suggests prior appendectomy.  Bone windows reveal no worrisome lytic or sclerotic osseous lesions.  IMPRESSION: No acute findings in the abdomen or pelvis to  explain the patient's history of left lower quadrant pain.  Specifically, no evidence for colonic diverticulitis.  No urinary stone disease.   Original Report Authenticated By: Kennith Center, M.D.    Dg Chest 2 View  08/25/2012  *RADIOLOGY REPORT*  Clinical Data: COPD with shortness of breath  CHEST - 2 VIEW  Comparison: 02/07/2011  Findings: The lungs are clear without focal infiltrate, edema, pneumothorax or pleural effusion. The cardiopericardial silhouette is within normal limits for size. Imaged bony structures of the thorax are intact.  IMPRESSION: Stable.  No acute cardiopulmonary process.   Original Report Authenticated By: Kennith Center, M.D.  I personally reviewed the imaging tests through PACS system I reviewed available ER/hospitalization records through the EMR   1. Abdominal pain       MDM  3:45 PM Patient feels better at this time.  Discharge home pain medicine.  D-dimer normal.  CT abdomen pelvis normal.  Discharge home in good condition.  PCP and GI followup        Lyanne Co, MD 08/25/12 [phone]

## 2012-08-25 NOTE — ED Notes (Signed)
Per pt:  Pt states that he started having left lower abdominal pain one week ago.  Yesterday, the pain became an intense throbbing pain along with shortness of breath and headaches.  Pt denies N/V/D.

## 2012-12-15 ENCOUNTER — Encounter: Payer: Self-pay | Admitting: Gastroenterology

## 2012-12-17 ENCOUNTER — Telehealth: Payer: Self-pay | Admitting: Gastroenterology

## 2012-12-17 NOTE — Telephone Encounter (Signed)
Received a voicemail from [name] saying that he wanted a call back within 12 hours or he was reporting [place] to the [facility].  He left his name and phone number only and did not state why he was calling.  I called [name] and he said that he didn't understand why he had to see Dr. [name] in the office on 7/24 when all he wants is to have his repeat colonoscopy.  I explained that our physicians want to see patients who are having symptoms prior to scheduling a procedure.  I offered to ask Dr. [name] if he needed to see him first as it is a follow up procedure.  He said that we always make him come to the office 6-7 times before scheduling a procedure so that we can get money out of him/his insurance ... he said we only do it because he has Medicare and Medicaid and it's our way of making money on him because they don't pay very well.  [name] said that Dr. [name] removed the left side of his colon two years ago and he's felt worse since the surgery.  He has constant pain in his left side, diarrhea 4-5 times per day (sometimes 6-7 times) followed by constipation, and acid reflux ... all since the surgery.  He is also now on blood thinners.  He has been to the ER looking for answers.  He said that the last time he saw Dr. [name], he said he doesn't know what is going on with him.    He said he wanted me to be in the exam room during his visit with Dr. [name] on 7/24 so that I could see what was going on and I could tell Dr. [name] want to do.  I told him that I am an administrative person and that the physician is the one who decides his treatment plan.  He was upset upon hearing this and said that he doesn't want to talk to me anymore if I couldn't tell the doctors what to do.  He wants someone who is in charge of the doctors and can tell them what to do to call him within the next 24 hours.   He said that if he doesn't get answers on 7/24, he first said that he will report [place] to the [facility].  He then said that he  will also file a malpractice lawsuit if he doesn't like what we do on the 24th.  He said he had no more use for me and wished me a good day.

## 2012-12-25 ENCOUNTER — Encounter: Payer: Self-pay | Admitting: Gastroenterology

## 2012-12-25 ENCOUNTER — Ambulatory Visit (INDEPENDENT_AMBULATORY_CARE_PROVIDER_SITE_OTHER): Payer: Medicare Other | Admitting: Gastroenterology

## 2012-12-25 VITALS — BP 110/78 | HR 74 | Ht 71.0 in | Wt 149.0 lb

## 2012-12-25 DIAGNOSIS — R1033 Periumbilical pain: Secondary | ICD-10-CM | POA: Insufficient documentation

## 2012-12-25 DIAGNOSIS — R197 Diarrhea, unspecified: Secondary | ICD-10-CM

## 2012-12-25 DIAGNOSIS — R1013 Epigastric pain: Secondary | ICD-10-CM

## 2012-12-25 DIAGNOSIS — Z86010 Personal history of colonic polyps: Secondary | ICD-10-CM

## 2012-12-25 MED ORDER — DEXLANSOPRAZOLE 60 MG PO CPDR: 60.0000 mg | DELAYED_RELEASE_CAPSULE | Freq: Every day | ORAL | Status: DC

## 2012-12-25 NOTE — Assessment & Plan Note (Signed)
Diarrhea is probably multifactorial including postoperative changes. Possible maldigestion related to pancreatic insufficiency, and alcohol abuse.  Recommendations #1 fecal elastase   #2 colonoscopy. If no abnormalities are seen I will take random biopsies

## 2012-12-25 NOTE — Progress Notes (Signed)
History of Present Illness: 57 year old white male with history of non-hereditary colonic polyposis, status post left hemicolectomy complicated by portal vein thrombosis here for evaluation of abdominal pain and followup for polyps. Over the past 6 months he's been complaining of chronic left periumbilical pain. It may be exacerbated by eating. He denies nausea vomiting or pyrosis. He's lost about 12 pounds in the past 2 months because of fear of of eating. CT scan in March, 2014 was unremarkable. Patient admits to drinking at least 4 beers a day. He also complains of frequent loose stools 4-5 times a day. He claims that stools are occasionally black and tarry. Last colonoscopy was 2012.    Past Medical History  Diagnosis Date  . Hx of colonic polyps 11/30/2010    tubular adenomas; had prior colectomy  . Arthritis   . Emphysema   . GERD (gastroesophageal reflux disease)   . Fibromyalgia   . COPD (chronic obstructive pulmonary disease)   . Depression   . Hyperlipidemia   . IBS (irritable bowel syndrome)   . Hearing loss   . Hypertension   . Fibromyalgia 1996  . Abdominal pain   . Night sweats   . Gum disease   . Diarrhea   . Vomiting   . Nausea   . Hx of blood clots   . Alcoholism    Past Surgical History  Procedure Laterality Date  . Colectomy      inguinal rectal colectomy  . Foot surgery      left  . Nose surgery    . Hand surgery      for left finger amputations  . Hemicolectomy  01/24/11  . Proctoscopy  01/24/11  . Appendectomy     family history includes Breast cancer in his mother and sister; Diabetes in his mother; Hypertension in his mother and sisters; and Stomach cancer in his maternal grandfather.  There is no history of Colon cancer. Current Outpatient Prescriptions  Medication Sig Dispense Refill  . fluticasone (FLOVENT HFA) 110 MCG/ACT inhaler Inhale 1 puff into the lungs 2 (two) times daily.      Marland Kitchen tiotropium (SPIRIVA) 18 MCG inhalation capsule Place 18 mcg  into inhaler and inhale daily.       No current facility-administered medications for this visit.   Allergies as of 12/25/2012 - Review Complete 12/25/2012  Allergen Reaction Noted  . Iohexol Hives 11/29/2010    reports that he has been smoking Cigarettes.  He has been smoking about 1.00 pack per day. He has never used smokeless tobacco. He reports that  drinks alcohol. He reports that he does not use illicit drugs.     Review of Systems: Pertinent positive and negative review of systems were noted in the above HPI section. All other review of systems were otherwise negative.  Vital signs were reviewed in today's medical record Physical Exam: General: he is a thin male in no acute distress Skin: anicteric Head: Normocephalic and atraumatic Eyes:  sclerae anicteric, EOMI Ears: Normal auditory acuity Mouth: No deformity or lesions Neck: Supple, no masses or thyromegaly Lungs: Clear throughout to auscultation Heart: Regular rate and rhythm; no murmurs, rubs or bruits Abdomen: Soft,  and non distended. No masses, hepatosplenomegaly or hernias noted. Normal Bowel sounds. There is mild tenderness to palpation in the left periumbilical area Rectal:deferred Musculoskeletal: Symmetrical with no gross deformities  Skin: No lesions on visible extremities Pulses:  Normal pulses noted Extremities: No clubbing, cyanosis, edema or deformities noted Neurological: Alert oriented x  4, grossly nonfocal Cervical Nodes:  No significant cervical adenopathy Inguinal Nodes: No significant inguinal adenopathy Psychological:  Alert and cooperative. Normal mood and affect

## 2012-12-25 NOTE — Patient Instructions (Addendum)

## 2012-12-25 NOTE — Assessment & Plan Note (Signed)
Plan followup colonoscopy once the issue of pain is resolved.

## 2012-12-25 NOTE — Assessment & Plan Note (Addendum)
Patient has a long history of chronic abdominal pain which seems to be have worsened over the past 6 months. CT scan was unremarkable. He's had labs drawn but I do not have the results. Pain could be do to ulcer or nonulcer dyspepsia.: Gastritis is a possibility. Pain from chronic pancreatitis is also a concern.  Note pt continues to drink EtOH.  Medications #1 begin Dexilant 60 mg daily #2 upper endoscopy #3 to consider pancreatic enzymes pending results of above.

## 2013-02-04 ENCOUNTER — Other Ambulatory Visit (INDEPENDENT_AMBULATORY_CARE_PROVIDER_SITE_OTHER): Payer: Medicare Other

## 2013-02-04 ENCOUNTER — Encounter: Payer: Self-pay | Admitting: Gastroenterology

## 2013-02-04 ENCOUNTER — Ambulatory Visit (AMBULATORY_SURGERY_CENTER): Payer: Medicare Other | Admitting: Gastroenterology

## 2013-02-04 ENCOUNTER — Other Ambulatory Visit: Payer: Self-pay | Admitting: *Deleted

## 2013-02-04 VITALS — BP 99/59 | HR 80 | Temp 97.8°F | Resp 24 | Ht 71.0 in | Wt 149.0 lb

## 2013-02-04 DIAGNOSIS — R197 Diarrhea, unspecified: Secondary | ICD-10-CM

## 2013-02-04 DIAGNOSIS — R1013 Epigastric pain: Secondary | ICD-10-CM

## 2013-02-04 DIAGNOSIS — R1033 Periumbilical pain: Secondary | ICD-10-CM

## 2013-02-04 DIAGNOSIS — K209 Esophagitis, unspecified without bleeding: Secondary | ICD-10-CM

## 2013-02-04 LAB — AMYLASE: Amylase: 43 U/L (ref 27–131)

## 2013-02-04 MED ORDER — TRAMADOL HCL 50 MG PO TABS: 50.0000 mg | ORAL_TABLET | Freq: Four times a day (QID) | ORAL | Status: DC | PRN

## 2013-02-04 MED ORDER — SODIUM CHLORIDE 0.9 % IV SOLN: 500.0000 mL | INTRAVENOUS | Status: DC

## 2013-02-04 NOTE — Op Note (Signed)
[facility] [address] [place], [address]   ENDOSCOPY PROCEDURE REPORT  PATIENT: [name], [name]  MR#: [id number] BIRTHDATE: 08-22-1955 , 57  yrs. old GENDER: Male ENDOSCOPIST: Louis Meckel, MD REFERRED BY:  Clyda Greener, M.D. PROCEDURE DATE:  02/04/2013 PROCEDURE:  EGD, diagnostic ASA CLASS:     Class II INDICATIONS:  periumbilical abdominal pain. MEDICATIONS: MAC sedation, administered by CRNA and propofol (Diprivan) 150mg  IV TOPICAL ANESTHETIC:  DESCRIPTION OF PROCEDURE: After the risks benefits and alternatives of the procedure were thoroughly explained, informed consent was obtained.  The LB [id number] endoscope was introduced through the mouth and advanced to the third portion of the duodenum. Without limitations.  The instrument was slowly withdrawn as the mucosa was fully examined.      Grade B.  erosive esophagitis was seen at the GE junction.   The remainder of the upper endoscopy exam was otherwise normal. Retroflexed views revealed no abnormalities.     The scope was then withdrawn from the patient and the procedure completed.  COMPLICATIONS: There were no complications. ENDOSCOPIC IMPRESSION: 1.   Grade B.  erosive esophagitis was seen at the GE junction 2.   The remainder of the upper endoscopy exam was otherwise normal  RECOMMENDATIONS: continued dexilantt Avoid alcohol Office visit 3 weeks  REPEAT EXAM:  eSigned:  Louis Meckel, MD 02/04/2013 3:11 PM   CC:

## 2013-02-04 NOTE — Progress Notes (Signed)
No egg or soy allergy.ewm No problems with past sedation. ewm Issues with blood clots after colon surgery. ewm

## 2013-02-04 NOTE — Patient Instructions (Addendum)
YOU HAD AN ENDOSCOPIC PROCEDURE TODAY AT THE [facility]: Refer to the procedure report that was given to you for any specific questions about what was found during the examination.  If the procedure report does not answer your questions, please call your gastroenterologist to clarify.  If you requested that your care partner not be given the details of your procedure findings, then the procedure report has been included in a sealed envelope for you to review at your convenience later.  YOU SHOULD EXPECT: Some feelings of bloating in the abdomen. Passage of more gas than usual.  Walking can help get rid of the air that was put into your GI tract during the procedure and reduce the bloating  DIET: Your first meal following the procedure should be a light meal and then it is ok to progress to your normal diet.  A half-sandwich or bowl of soup is an example of a good first meal.  Heavy or fried foods are harder to digest and may make you feel nauseous or bloated.  Likewise meals heavy in dairy and vegetables can cause extra gas to form and this can also increase the bloating.  Drink plenty of fluids but you should avoid alcoholic beverages for 24 hours.  ACTIVITY: Your care partner should take you home directly after the procedure.  You should plan to take it easy, moving slowly for the rest of the day.  You can resume normal activity the day after the procedure however you should NOT DRIVE or use heavy machinery for 24 hours (because of the sedation medicines used during the test).    SYMPTOMS TO REPORT IMMEDIATELY: A gastroenterologist can be reached at any hour.  During normal business hours, 8:30 AM to 5:00 PM Monday through Friday, call [phone].  After hours and on weekends, please call the GI answering service at [phone] who will take a message and have the physician on call contact you.   Following upper endoscopy (EGD)  Vomiting of blood or coffee ground material  New  chest pain or pain under the shoulder blades  Painful or persistently difficult swallowing  New shortness of breath  Fever of 100F or higher  Black, tarry-looking stools  FOLLOW UP: Our staff will call the home number listed on your records the next business day following your procedure to check on you and address any questions or concerns that you may have at that time regarding the information given to you following your procedure. This is a courtesy call and so if there is no answer at the home number and we have not heard from you through the emergency physician on call, we will assume that you have returned to your regular daily activities without incident.  SIGNATURES/CONFIDENTIALITY: You and/or your care partner have signed paperwork which will be entered into your electronic medical record.  These signatures attest to the fact that that the information above on your After Visit Summary has been reviewed and is understood.  Full responsibility of the confidentiality of this discharge information lies with you and/or your care-partner.  Blood work today Barrister's clerk and other medications Avoid alcohol Dr. Marzetta Board nurse will call you to set up office appointment for 3 weeks

## 2013-02-04 NOTE — Progress Notes (Signed)
Report to pacu rn, vss, bbs=clear 

## 2013-02-04 NOTE — Progress Notes (Signed)
Before discharge, pt requests pain medicine.  States, "I feel like this was a waste of time if I'm still hurting.  Why didn't he do a colonoscopy?"  I spoke with Dr. [name] and explained the pt's concerns.  Dr. [name] states that he will see what his labwork shows and discuss the options with the pt at his OV coming up.  Also, Ultram rx prescribed.  I explained this to the pt and understanding voiced

## 2013-02-04 NOTE — Progress Notes (Signed)
Patient did not experience any of the following events: a burn prior to discharge; a fall within the facility; wrong site/side/patient/procedure/implant event; or a hospital transfer or hospital admission upon discharge from the facility. (G8907) Patient did not have preoperative order for IV antibiotic SSI prophylaxis. (G8918)  

## 2013-02-05 ENCOUNTER — Telehealth: Payer: Self-pay | Admitting: *Deleted

## 2013-02-05 NOTE — Telephone Encounter (Signed)
  Follow up Call-  Call back number 02/04/2013 11/30/2010 11/29/2010  Post procedure Call Back phone  # [phone]  Permission to leave phone message Yes - -     Patient questions:  Do you have a fever, pain , or abdominal swelling? no Pain Score  0 *  Have you tolerated food without any problems? yes  Have you been able to return to your normal activities? yes  Do you have any questions about your discharge instructions: Diet   no Medications  no Follow up visit  no  Do you have questions or concerns about your Care? no  Actions: * If pain score is 4 or above: No action needed, pain <4.

## 2013-03-03 ENCOUNTER — Ambulatory Visit: Payer: Medicare Other | Admitting: Gastroenterology

## 2013-08-18 IMAGING — US US ABDOMEN COMPLETE
1 series · 14 of 25 positions shown · non-contrast
Comparison: 02/07/2011

CLINICAL DATA: Nausea, vomiting, reflux disease, history of right
portal vein thrombosis

ABDOMINAL ULTRASOUND COMPLETE

[Series 1: us abdomen complete · 0.32mm/px · 14 of 134 slices shown]
[im 1/134]
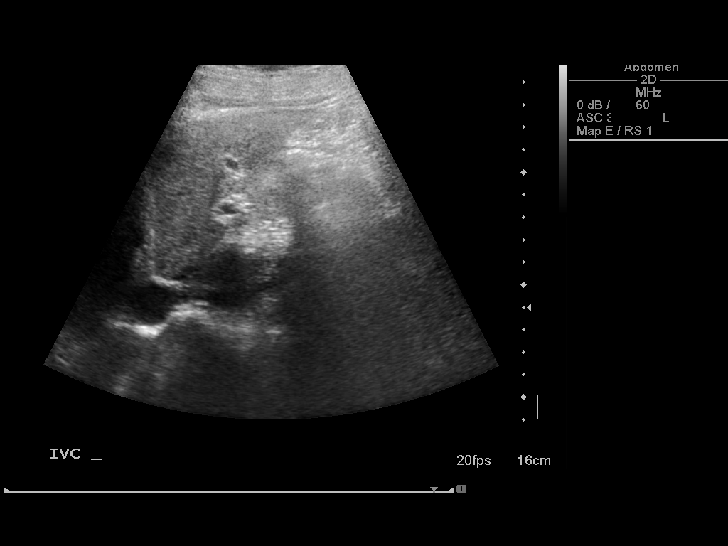
[im 12/134]
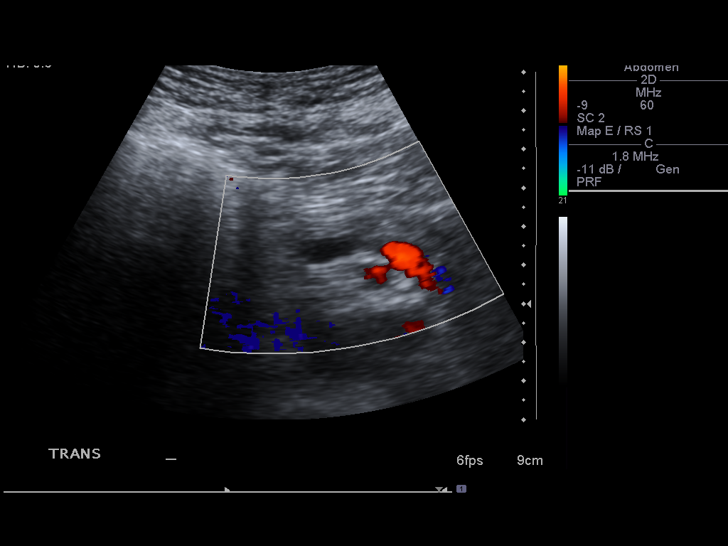
[im 23/134]
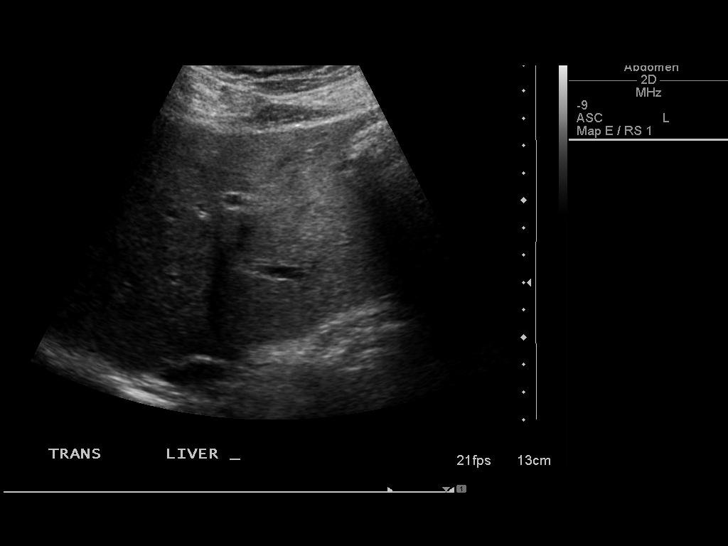
[im 34/134]
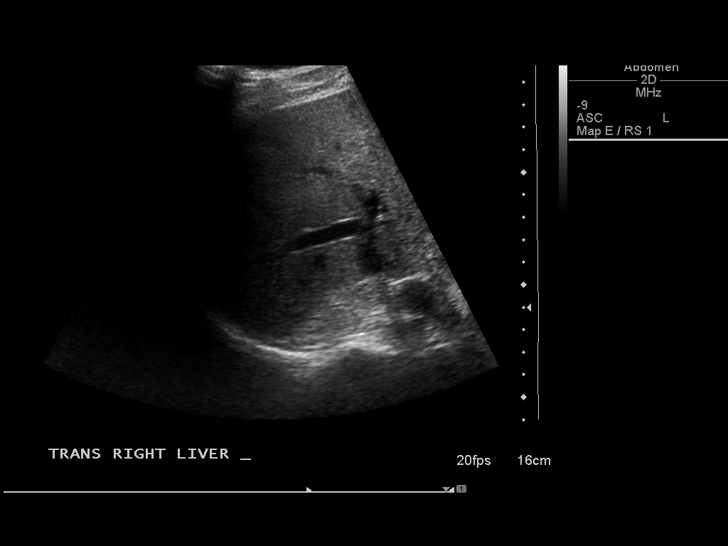
[im 45/134]
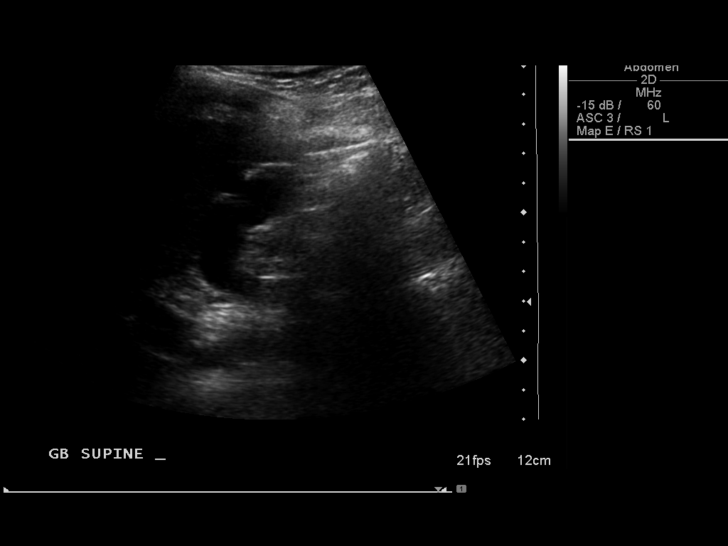
[im 50/134]
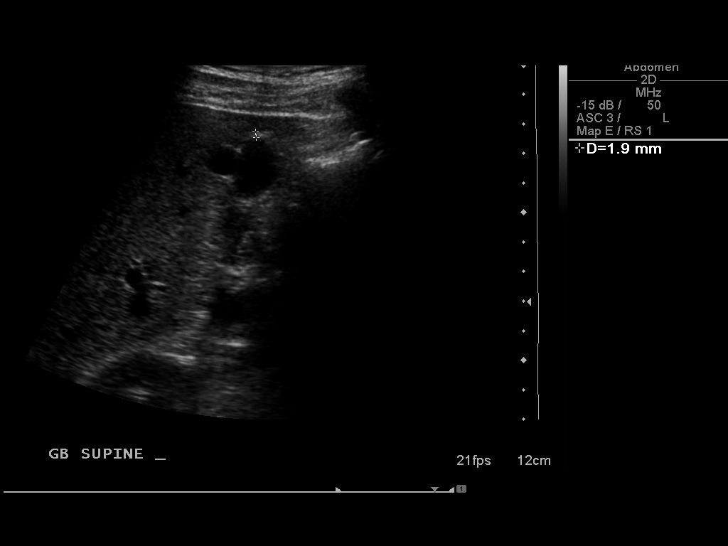
[im 61/134]
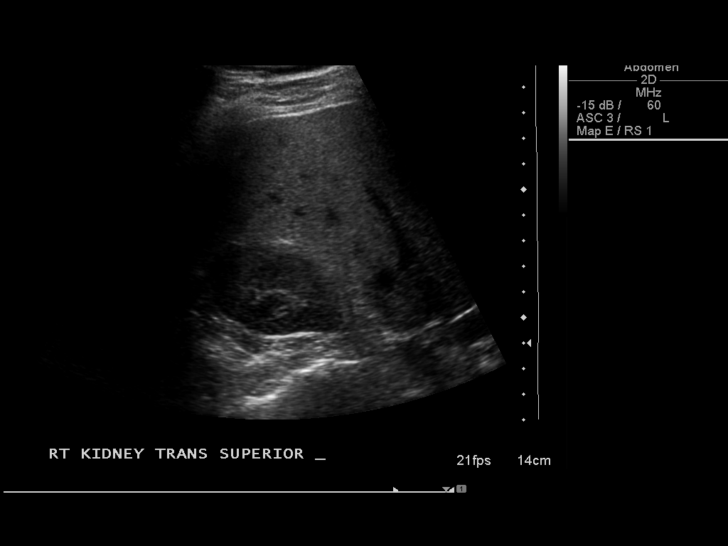
[im 73/134]
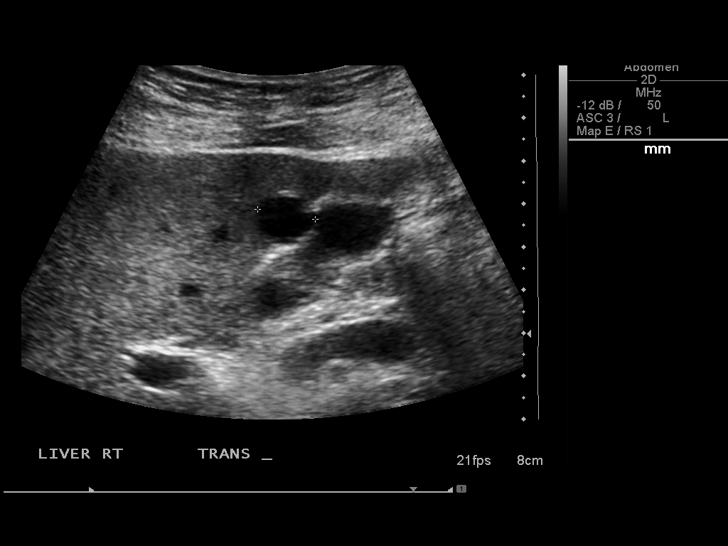
[im 84/134]
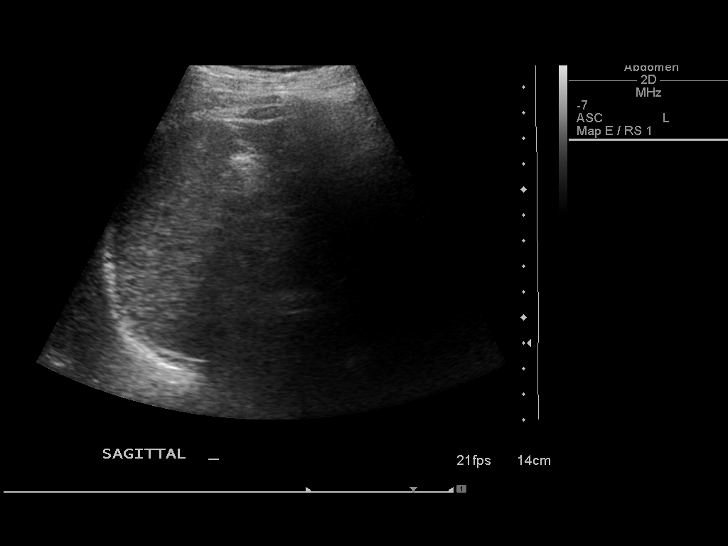
[im 89/134]
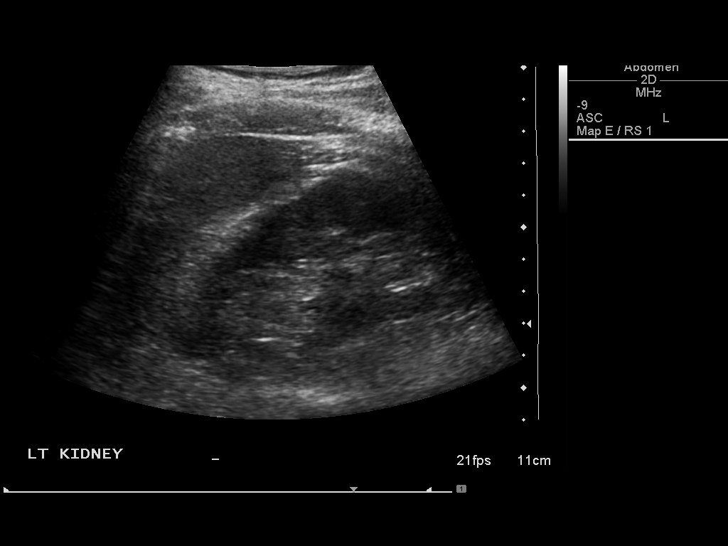
[im 100/134]
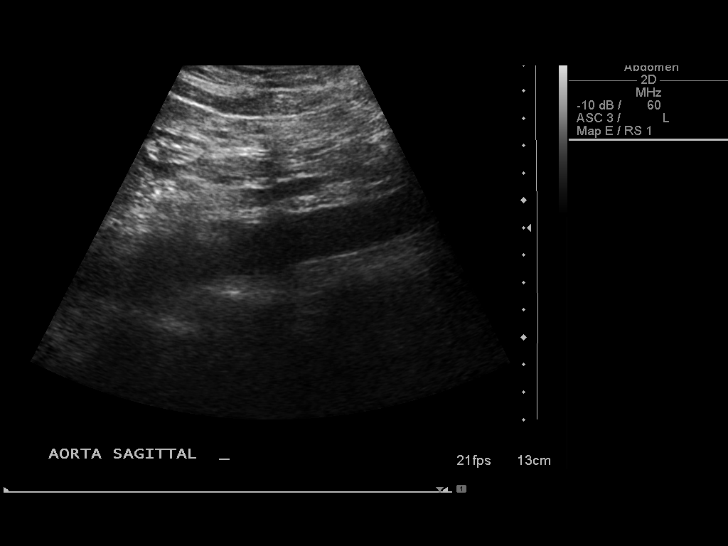
[im 111/134]
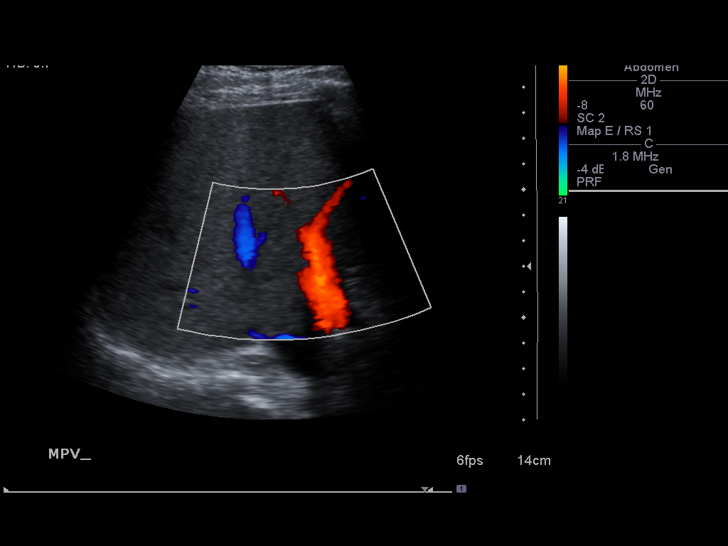
[im 122/134]
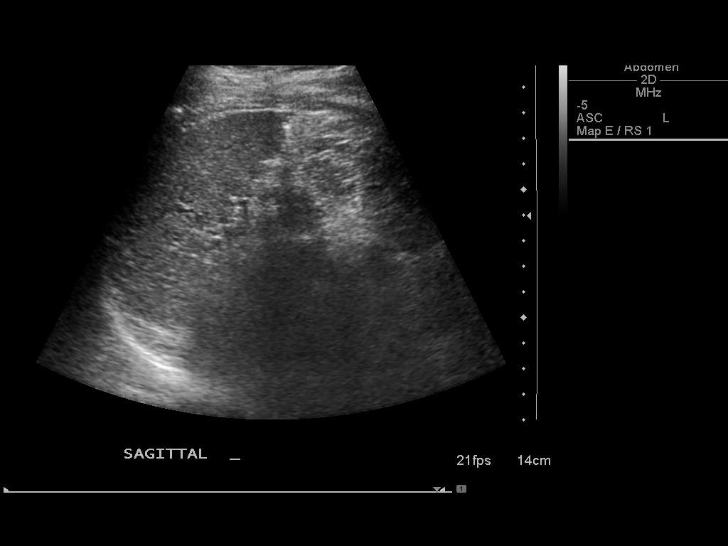
[im 134/134]
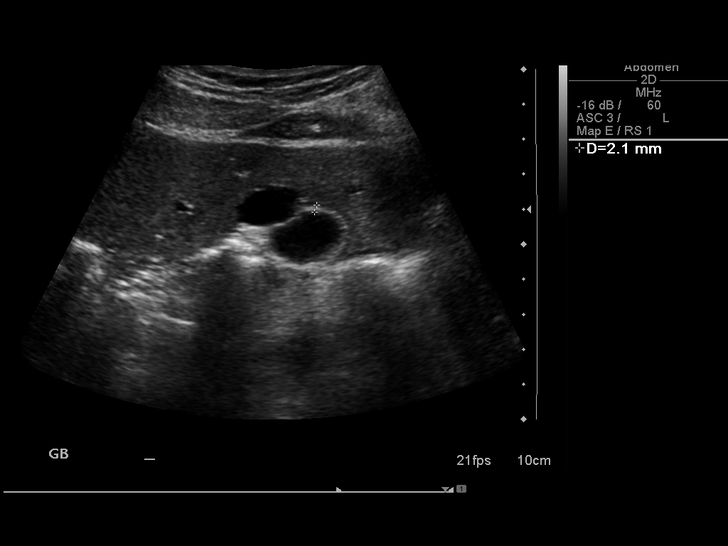

[14 of 25 positions shown; findings below may reference images not displayed]

FINDINGS: Gallbladder:  No gallstones, gallbladder wall thickening, or
pericholecystic fluid.

Common Bile Duct:  Within normal limits in caliber.

Liver: Normal echogenicity.  1.7 cm hypoechoic cyst along the
gallbladder fossa, unchanged.  No biliary dilatation.  Main portal
vein, portal venous confluence, and proximal right and left portal
veins appear patent by color flow.

IVC:  Appears normal.

Pancreas:  No abnormality identified.

Spleen:  Within normal limits in size and echotexture.

Right kidney:  Normal in size and parenchymal echogenicity.  No
evidence of mass or hydronephrosis.

Left kidney:  Normal in size and parenchymal echogenicity.  No
evidence of mass or hydronephrosis.

Abdominal Aorta:  No aneurysm identified.
IMPRESSION: No acute finding by abdominal ultrasound

Stable 1.7 cm cyst in the liver along the gallbladder fossa

Portal vein appears patent.

## 2013-09-11 ENCOUNTER — Emergency Department (HOSPITAL_COMMUNITY)
Admission: EM | Admit: 2013-09-11 | Discharge: 2013-09-11 | Discharge status: Against Medical Advice | Payer: Medicare Other | Attending: Emergency Medicine | Admitting: Emergency Medicine

## 2013-09-11 DIAGNOSIS — F17200 Nicotine dependence, unspecified, uncomplicated: Secondary | ICD-10-CM | POA: Insufficient documentation

## 2013-09-11 DIAGNOSIS — J438 Other emphysema: Secondary | ICD-10-CM | POA: Insufficient documentation

## 2013-09-11 DIAGNOSIS — I10 Essential (primary) hypertension: Secondary | ICD-10-CM | POA: Insufficient documentation

## 2013-09-11 DIAGNOSIS — R51 Headache: Secondary | ICD-10-CM | POA: Insufficient documentation

## 2013-09-11 DIAGNOSIS — R109 Unspecified abdominal pain: Secondary | ICD-10-CM | POA: Insufficient documentation

## 2013-09-11 NOTE — ED Notes (Signed)
Pt c/o shob x3 days. Pt states that he has COPD and called his MD but they cant see him until next month.  Pt also c/o abd pain and headaches.

## 2013-09-11 NOTE — ED Notes (Signed)
After telling pt that his O2 was 98% on room air pt jumps up out of triage chair and yells "yall always fuck things up!"  Grabs his coat throws his patient labels and storms out of triage out through lobby.  Pt left cell phone in triage chair. Placed label on it and left at triage nurse's desk.  Pt was ambulating without difficulty on the way out of ED.

## 2014-01-11 NOTE — Telephone Encounter (Signed)
Phone call - encounter closed. 

## 2014-01-13 ENCOUNTER — Emergency Department (HOSPITAL_COMMUNITY)
Admission: EM | Admit: 2014-01-13 | Discharge: 2014-01-13 | Discharge status: Against Medical Advice | Payer: Medicare Other | Attending: Emergency Medicine | Admitting: Emergency Medicine

## 2014-01-13 ENCOUNTER — Encounter (HOSPITAL_COMMUNITY): Payer: Self-pay | Admitting: Emergency Medicine

## 2014-01-13 DIAGNOSIS — S4980XA Other specified injuries of shoulder and upper arm, unspecified arm, initial encounter: Secondary | ICD-10-CM | POA: Insufficient documentation

## 2014-01-13 DIAGNOSIS — M25511 Pain in right shoulder: Secondary | ICD-10-CM

## 2014-01-13 DIAGNOSIS — R058 Other specified cough: Secondary | ICD-10-CM

## 2014-01-13 DIAGNOSIS — J441 Chronic obstructive pulmonary disease with (acute) exacerbation: Secondary | ICD-10-CM | POA: Insufficient documentation

## 2014-01-13 DIAGNOSIS — Z8739 Personal history of other diseases of the musculoskeletal system and connective tissue: Secondary | ICD-10-CM | POA: Diagnosis not present

## 2014-01-13 DIAGNOSIS — Z8659 Personal history of other mental and behavioral disorders: Secondary | ICD-10-CM | POA: Insufficient documentation

## 2014-01-13 DIAGNOSIS — F17200 Nicotine dependence, unspecified, uncomplicated: Secondary | ICD-10-CM | POA: Insufficient documentation

## 2014-01-13 DIAGNOSIS — Z860100 Personal history of colon polyps, unspecified: Secondary | ICD-10-CM | POA: Insufficient documentation

## 2014-01-13 DIAGNOSIS — S298XXA Other specified injuries of thorax, initial encounter: Secondary | ICD-10-CM | POA: Insufficient documentation

## 2014-01-13 DIAGNOSIS — W19XXXA Unspecified fall, initial encounter: Secondary | ICD-10-CM

## 2014-01-13 DIAGNOSIS — R296 Repeated falls: Secondary | ICD-10-CM | POA: Diagnosis not present

## 2014-01-13 DIAGNOSIS — Z8719 Personal history of other diseases of the digestive system: Secondary | ICD-10-CM | POA: Diagnosis not present

## 2014-01-13 DIAGNOSIS — Y9389 Activity, other specified: Secondary | ICD-10-CM | POA: Diagnosis not present

## 2014-01-13 DIAGNOSIS — I10 Essential (primary) hypertension: Secondary | ICD-10-CM | POA: Insufficient documentation

## 2014-01-13 DIAGNOSIS — R0789 Other chest pain: Secondary | ICD-10-CM

## 2014-01-13 DIAGNOSIS — H9190 Unspecified hearing loss, unspecified ear: Secondary | ICD-10-CM | POA: Diagnosis not present

## 2014-01-13 DIAGNOSIS — R05 Cough: Secondary | ICD-10-CM

## 2014-01-13 DIAGNOSIS — S46909A Unspecified injury of unspecified muscle, fascia and tendon at shoulder and upper arm level, unspecified arm, initial encounter: Secondary | ICD-10-CM | POA: Diagnosis not present

## 2014-01-13 DIAGNOSIS — Y929 Unspecified place or not applicable: Secondary | ICD-10-CM | POA: Diagnosis not present

## 2014-01-13 DIAGNOSIS — R053 Chronic cough: Secondary | ICD-10-CM

## 2014-01-13 DIAGNOSIS — J449 Chronic obstructive pulmonary disease, unspecified: Secondary | ICD-10-CM

## 2014-01-13 DIAGNOSIS — M25512 Pain in left shoulder: Secondary | ICD-10-CM

## 2014-01-13 LAB — BASIC METABOLIC PANEL
ANION GAP: 16 — AB (ref 5–15)
BUN: 8 mg/dL (ref 6–23)
CALCIUM: 9.2 mg/dL (ref 8.4–10.5)
CO2: 20 mEq/L (ref 19–32)
CREATININE: 0.91 mg/dL (ref 0.50–1.35)
Chloride: 105 mEq/L (ref 96–112)
Glucose, Bld: 132 mg/dL — ABNORMAL HIGH (ref 70–99)
Potassium: 3.5 mEq/L — ABNORMAL LOW (ref 3.7–5.3)
SODIUM: 141 meq/L (ref 137–147)

## 2014-01-13 LAB — DIFFERENTIAL
BASOS ABS: 0.1 10*3/uL (ref 0.0–0.1)
BASOS PCT: 1 % (ref 0–1)
EOS ABS: 0.3 10*3/uL (ref 0.0–0.7)
EOS PCT: 4 % (ref 0–5)
Lymphocytes Relative: 22 % (ref 12–46)
Lymphs Abs: 1.7 10*3/uL (ref 0.7–4.0)
MONO ABS: 1.1 10*3/uL — AB (ref 0.1–1.0)
MONOS PCT: 13 % — AB (ref 3–12)
Neutro Abs: 4.9 10*3/uL (ref 1.7–7.7)
Neutrophils Relative %: 60 % (ref 43–77)

## 2014-01-13 LAB — I-STAT TROPONIN, ED: TROPONIN I, POC: 0 ng/mL (ref 0.00–0.08)

## 2014-01-13 LAB — CBC
HCT: 43.6 % (ref 39.0–52.0)
Hemoglobin: 15.8 g/dL (ref 13.0–17.0)
MCH: 33.6 pg (ref 26.0–34.0)
MCHC: 36.2 g/dL — ABNORMAL HIGH (ref 30.0–36.0)
MCV: 92.8 fL (ref 78.0–100.0)
Platelets: 316 10*3/uL (ref 150–400)
RBC: 4.7 MIL/uL (ref 4.22–5.81)
RDW: 11.6 % (ref 11.5–15.5)
WBC: 8.2 10*3/uL (ref 4.0–10.5)

## 2014-01-13 NOTE — ED Notes (Signed)
Patient getting extremely upset with the Pa at the bedside.

## 2014-01-13 NOTE — ED Notes (Signed)
Patient reports that he no longer wants any care. The patient would like a phone call to clarify his care.

## 2014-01-13 NOTE — ED Notes (Signed)
Pt reports falling prior to arrival while building a deck. Pt reports that he has centralized chest pain that began two days ago. Pt also reports having chest congestion with a productive cough with clear mucus. Pt also reports bilateral shoulder pain and left arm pain. Pt is A/O x4 and vitals are WDL. Pt reports having COPD. Pt states the fall "knocked the breath out of me."

## 2014-01-16 NOTE — ED Provider Notes (Signed)
CSN: [id number]     Arrival date & time 01/13/14  1355 History   First MD Initiated Contact with Patient 01/13/14 1416     Chief Complaint  Patient presents with  . Chest Pain  . Fall  . Arm Pain     (Consider location/radiation/quality/duration/timing/severity/associated sxs/prior Treatment) HPI Comments: Jermane Brayboy Harriott is a 58 y.o. Male with a PMHx of COPD presenting today after a fall off his front porch. Pt states the fall was from a height of approx 66ft. States he fell down onto bilateral shoulders which are now sore, achy, moderate in severity, nonradiating, with no known aggravating or alleviating factors. He also reports he has chest pain across his anterior chest which began after the fall, constant, nonradiating, without aggravating or alleviating factors. He also reports that he has had a cough which is chronic related to his COPD, but over the last few days he's had some clearish sputum production. When asked if this is "normal" for him, he yells "spitting up clear stuff isn't normal". Unclear whether pt has sputum production regularly or not, and if this is an acutely changed event for him. States he was SOB after the fall because it "knocked the breath out" of him. Denies head injury, LOC, abrasions/lacerations, abd pain, N/V/D/C, hemoptysis, diaphoresis, LE weakness, paresthesias, or inability to ambulate after the event. Denies incontinence of urine or stool, back pain, or LE swelling. Pt is UTD with tetanus shot.   Patient is a 58 y.o. male presenting with chest pain, fall, and arm pain. The history is provided by the patient. No language interpreter was used.  Chest Pain Associated symptoms: cough (chronic per pt) and shortness of breath (for a few seconds after the fall)   Associated symptoms: no abdominal pain, no back pain, no diaphoresis, no dizziness, no headache, no nausea, no numbness, not vomiting and no weakness   Fall This is a new problem. The current episode started  today. The problem has been unchanged. Associated symptoms include arthralgias (b/l shoulders), chest pain and coughing (chronic per pt). Pertinent negatives include no abdominal pain, diaphoresis, headaches, joint swelling, myalgias, nausea, neck pain, numbness, visual change, vomiting or weakness. Nothing aggravates the symptoms. He has tried nothing for the symptoms. The treatment provided no relief.  Arm Pain Associated symptoms include arthralgias (b/l shoulders), chest pain and coughing (chronic per pt). Pertinent negatives include no abdominal pain, diaphoresis, headaches, joint swelling, myalgias, nausea, neck pain, numbness, visual change, vomiting or weakness.    Past Medical History  Diagnosis Date  . Hx of colonic polyps 11/30/2010    tubular adenomas; had prior colectomy  . Arthritis   . Emphysema   . GERD (gastroesophageal reflux disease)   . Fibromyalgia   . COPD (chronic obstructive pulmonary disease)   . Depression   . Hyperlipidemia   . IBS (irritable bowel syndrome)   . Hearing loss   . Hypertension   . Fibromyalgia 1996  . Abdominal pain   . Night sweats   . Gum disease   . Diarrhea   . Vomiting   . Nausea   . Hx of blood clots   . Alcoholism    Past Surgical History  Procedure Laterality Date  . Colectomy      inguinal rectal colectomy  . Foot surgery      left  . Nose surgery    . Hand surgery      for left finger amputations  . Hemicolectomy  01/24/11  . Proctoscopy  01/24/11  . Appendectomy    . Colonoscopy    . Polypectomy     Family History  Problem Relation Age of Onset  . Hypertension Mother   . Diabetes Mother   . Breast cancer Mother   . Breast cancer Sister   . Stomach cancer Maternal Grandfather   . Hypertension Sister   . Hypertension Sister   . Colon cancer Neg Hx    History  Substance Use Topics  . Smoking status: Current Every Day Smoker -- 1.00 packs/day    Types: Cigarettes  . Smokeless tobacco: Never Used  . Alcohol Use:  No     Comment: no alcohol since 01-07-13    Review of Systems  Constitutional: Negative for diaphoresis.  Respiratory: Positive for cough (chronic per pt) and shortness of breath (for a few seconds after the fall). Negative for chest tightness and wheezing.   Cardiovascular: Positive for chest pain. Negative for leg swelling.  Gastrointestinal: Negative for nausea, vomiting, abdominal pain, diarrhea and constipation.  Musculoskeletal: Positive for arthralgias (b/l shoulders). Negative for back pain, joint swelling, myalgias, neck pain and neck stiffness.  Skin: Negative for wound.  Neurological: Negative for dizziness, syncope, weakness, numbness and headaches.  Psychiatric/Behavioral: Negative for confusion.  10 Systems reviewed and are negative for acute change except as noted in the HPI.     Allergies  Iohexol  Home Medications   Prior to Admission medications   Medication Sig Start Date End Date Taking? Authorizing Provider  tiotropium (SPIRIVA) 18 MCG inhalation capsule Place 18 mcg into inhaler and inhale 2 (two) times daily.    Yes Historical Provider, MD   BP 136/90  Pulse 94  Temp(Src) 98.4 F (36.9 C) (Oral)  Resp 20  SpO2 97% Physical Exam  Nursing note and vitals reviewed. Constitutional: He is oriented to person, place, and time. Vital signs are normal. He appears well-developed and well-nourished. No distress.  VSS, NAD  HENT:  Head: Normocephalic and atraumatic.  Mouth/Throat: Mucous membranes are normal.  Eyes: Conjunctivae and EOM are normal. Right eye exhibits no discharge. Left eye exhibits no discharge.  Neck:  Unable to perform cervical spine exam due to pt refusing to continue exam  Cardiovascular: Normal rate, regular rhythm, normal heart sounds and intact distal pulses.  Exam reveals no gallop and no friction rub.   No murmur heard. Pulmonary/Chest: Effort normal and breath sounds normal. No respiratory distress. He has no decreased breath sounds.  He has no wheezes. He has no rhonchi. He has no rales. He exhibits no deformity and no retraction.  CTAB, no w/r/r, no decreased breath sounds or chest wall deformity  Abdominal: Normal appearance. He exhibits no distension.  Unable to perform abd exam due to pt refusing to continue exam  Musculoskeletal:  Unable to perform full MsK exam due to pt refusing to continue exam. Pt moving all extremities with ease, including b/l shoulders/arms. No obvious deformities or unilateral deficits  Neurological: He is alert and oriented to person, place, and time.  Unable to perform full neuro exam due to pt refusing to continue exam. Pt moving all extremities equally without focal deficits obviously apparent  Skin: Skin is warm, dry and intact. No rash noted.  No obvious abrasions or lacerations although full exam was not able to be performed  Psychiatric: He is aggressive.  Verbally aggressive towards provider    ED Course  Procedures (including critical care time) Labs Review Labs Reviewed  CBC - Abnormal; Notable for the following:  MCHC 36.2 (*)    All other components within normal limits  BASIC METABOLIC PANEL - Abnormal; Notable for the following:    Potassium 3.5 (*)    Glucose, Bld 132 (*)    Anion gap 16 (*)    All other components within normal limits  DIFFERENTIAL - Abnormal; Notable for the following:    Monocytes Relative 13 (*)    Monocytes Absolute 1.1 (*)    All other components within normal limits  I-STAT TROPOININ, ED    Imaging Review No results found.   EKG Interpretation None      MDM   Final diagnoses:  Bilateral shoulder pain  Cough, persistent  Cough productive of clear sputum  Chronic obstructive pulmonary disease, unspecified COPD, unspecified chronic bronchitis type  Acute chest wall pain  Fall, initial encounter   58y/o male here for fall off porch and multiple other complaints. After obtaining history, began performing physical and stated to the  patient that his "lungs sounded pretty clear" at which time the pt became very upset, stating "how can I sound clear if I have COPD? You tell me something, what is COPD?" He became increasingly more aggressive towards me, at which time I asked him if he wanted me to continue with my physical exam and evaluation of his complaints, or whether he wanted another provider. He yelled "I'd like to see an Scientist, physiological". I advised the charge nurse of his request, who then told me he had left AMA. I was unable to perform a full physical and evaluation of his complaints prior to him leaving. Labs were not back prior to him leaving, and EKG and CXR had not been obtained prior to him leaving. Full evaluation had not been obtained.   BP 136/90  Pulse 94  Temp(Src) 98.4 F (36.9 C) (Oral)  Resp 20  SpO2 97%   [employer], PA-C 01/16/14 [phone]

## 2014-01-19 NOTE — ED Provider Notes (Signed)
Medical screening examination/treatment/procedure(s) were performed by non-physician practitioner and as supervising physician I was immediately available for consultation/collaboration.    Ernestina Patches, MD 01/19/14 [phone]

## 2014-01-25 ENCOUNTER — Emergency Department (HOSPITAL_COMMUNITY)
Admission: EM | Admit: 2014-01-25 | Discharge: 2014-01-25 | Disposition: A | Discharge status: Home / Self Care | Payer: Medicare Other | Attending: Emergency Medicine | Admitting: Emergency Medicine

## 2014-01-25 ENCOUNTER — Encounter (HOSPITAL_COMMUNITY): Payer: Self-pay | Admitting: Emergency Medicine

## 2014-01-25 DIAGNOSIS — Z862 Personal history of diseases of the blood and blood-forming organs and certain disorders involving the immune mechanism: Secondary | ICD-10-CM | POA: Insufficient documentation

## 2014-01-25 DIAGNOSIS — Z79899 Other long term (current) drug therapy: Secondary | ICD-10-CM | POA: Insufficient documentation

## 2014-01-25 DIAGNOSIS — F17200 Nicotine dependence, unspecified, uncomplicated: Secondary | ICD-10-CM | POA: Diagnosis not present

## 2014-01-25 DIAGNOSIS — J438 Other emphysema: Secondary | ICD-10-CM | POA: Diagnosis not present

## 2014-01-25 DIAGNOSIS — Z8659 Personal history of other mental and behavioral disorders: Secondary | ICD-10-CM | POA: Insufficient documentation

## 2014-01-25 DIAGNOSIS — Z9889 Other specified postprocedural states: Secondary | ICD-10-CM | POA: Insufficient documentation

## 2014-01-25 DIAGNOSIS — I10 Essential (primary) hypertension: Secondary | ICD-10-CM | POA: Insufficient documentation

## 2014-01-25 DIAGNOSIS — M25529 Pain in unspecified elbow: Secondary | ICD-10-CM | POA: Diagnosis not present

## 2014-01-25 DIAGNOSIS — M25522 Pain in left elbow: Secondary | ICD-10-CM

## 2014-01-25 DIAGNOSIS — M129 Arthropathy, unspecified: Secondary | ICD-10-CM | POA: Diagnosis not present

## 2014-01-25 DIAGNOSIS — Z860100 Personal history of colon polyps, unspecified: Secondary | ICD-10-CM | POA: Insufficient documentation

## 2014-01-25 DIAGNOSIS — Z8639 Personal history of other endocrine, nutritional and metabolic disease: Secondary | ICD-10-CM | POA: Insufficient documentation

## 2014-01-25 DIAGNOSIS — R52 Pain, unspecified: Secondary | ICD-10-CM | POA: Diagnosis not present

## 2014-01-25 DIAGNOSIS — Z8719 Personal history of other diseases of the digestive system: Secondary | ICD-10-CM | POA: Insufficient documentation

## 2014-01-25 DIAGNOSIS — M25519 Pain in unspecified shoulder: Secondary | ICD-10-CM | POA: Diagnosis not present

## 2014-01-25 DIAGNOSIS — M25512 Pain in left shoulder: Secondary | ICD-10-CM

## 2014-01-25 DIAGNOSIS — Z8669 Personal history of other diseases of the nervous system and sense organs: Secondary | ICD-10-CM | POA: Insufficient documentation

## 2014-01-25 MED ORDER — IBUPROFEN 600 MG PO TABS: 600.0000 mg | ORAL_TABLET | Freq: Four times a day (QID) | ORAL | Status: DC | PRN

## 2014-01-25 MED ORDER — HYDROCODONE-ACETAMINOPHEN 5-325 MG PO TABS: 1.0000 | ORAL_TABLET | ORAL | Status: DC | PRN

## 2014-01-25 NOTE — ED Provider Notes (Signed)
CSN: [id number]     Arrival date & time 01/25/14  1030 History   First MD Initiated Contact with Patient 01/25/14 1033     Chief Complaint  Patient presents with  . Shoulder Pain  . Elbow Pain     (Consider location/radiation/quality/duration/timing/severity/associated sxs/prior Treatment) HPI Comments: Patient presents to the emergency department with chief complaint of left elbow and left shoulder pain. He states that he thinks that he overworked it. States that he has been building a deck at his house. He states that the pain is worsened with movement. It is better with rest. He has not tried taking anything to alleviate his symptoms. He states that he can take NSAIDs. He denies any fevers chills. Denies any traumatic injury. He states that he does have some radiating symptoms, as well as some associated numbness and tingling in his fingertips.  The history is provided by the patient. No language interpreter was used.    Past Medical History  Diagnosis Date  . Hx of colonic polyps 11/30/2010    tubular adenomas; had prior colectomy  . Arthritis   . Emphysema   . GERD (gastroesophageal reflux disease)   . Fibromyalgia   . COPD (chronic obstructive pulmonary disease)   . Depression   . Hyperlipidemia   . IBS (irritable bowel syndrome)   . Hearing loss   . Hypertension   . Fibromyalgia 1996  . Abdominal pain   . Night sweats   . Gum disease   . Diarrhea   . Vomiting   . Nausea   . Hx of blood clots   . Alcoholism    Past Surgical History  Procedure Laterality Date  . Colectomy      inguinal rectal colectomy  . Foot surgery      left  . Nose surgery    . Hand surgery      for left finger amputations  . Hemicolectomy  01/24/11  . Proctoscopy  01/24/11  . Appendectomy    . Colonoscopy    . Polypectomy     Family History  Problem Relation Age of Onset  . Hypertension Mother   . Diabetes Mother   . Breast cancer Mother   . Breast cancer Sister   . Stomach cancer  Maternal Grandfather   . Hypertension Sister   . Hypertension Sister   . Colon cancer Neg Hx    History  Substance Use Topics  . Smoking status: Current Every Day Smoker -- 1.00 packs/day    Types: Cigarettes  . Smokeless tobacco: Never Used  . Alcohol Use: No     Comment: no alcohol since 01-07-13    Review of Systems  Constitutional: Negative for fever and chills.  Respiratory: Negative for shortness of breath.   Cardiovascular: Negative for chest pain.  Gastrointestinal: Negative for nausea, vomiting, diarrhea and constipation.  Genitourinary: Negative for dysuria.  Musculoskeletal: Positive for arthralgias.      Allergies  Iohexol  Home Medications   Prior to Admission medications   Medication Sig Start Date End Date Taking? Authorizing Provider  tiotropium (SPIRIVA) 18 MCG inhalation capsule Place 18 mcg into inhaler and inhale 2 (two) times daily.    Yes Historical Provider, MD  HYDROcodone-acetaminophen (NORCO/VICODIN) 5-325 MG per tablet Take 1-2 tablets by mouth every 4 (four) hours as needed for moderate pain or severe pain. 01/25/14   Montine Circle, PA-C  ibuprofen (ADVIL,MOTRIN) 600 MG tablet Take 1 tablet (600 mg total) by mouth every 6 (six) hours as  needed. 01/25/14   Montine Circle, PA-C   BP 151/94  Pulse 94  Temp(Src) 98.1 F (36.7 C) (Oral)  Resp 18  SpO2 98% Physical Exam  Nursing note and vitals reviewed. Constitutional: He is oriented to person, place, and time. He appears well-developed and well-nourished.  HENT:  Head: Normocephalic and atraumatic.  Eyes: Conjunctivae and EOM are normal.  Neck: Normal range of motion.  Cardiovascular: Normal rate.   Pulmonary/Chest: Effort normal.  Abdominal: He exhibits no distension.  Musculoskeletal: Normal range of motion.  Left shoulder range of motion and strength 5/5, no bony abnormality or deformity  Left elbow range of motion and strength 5/5, no bony abnormality or deformity  Neurological: He is  alert and oriented to person, place, and time.  Sensation and strength intact throughout  Skin: Skin is dry.  No cellulitis, no erythema, no evidence of infected joint  Psychiatric: He has a normal mood and affect. His behavior is normal. Judgment and thought content normal.    ED Course  Procedures (including critical care time) Labs Review Labs Reviewed - No data to display  Imaging Review No results found.   EKG Interpretation None      MDM   Final diagnoses:  Shoulder pain, acute, left  Left elbow pain    Patient with elbow and shoulder pain. Denies any traumatic injury. States that he has been building a deck at his house. Believe this is a overuse injury. No tenderness to palpation. Will give some pain medicine, and recommend orthopedic followup. Recommend rice therapy.    Montine Circle, PA-C 01/25/14 1139

## 2014-01-25 NOTE — Discharge Instructions (Signed)

## 2014-01-25 NOTE — ED Provider Notes (Signed)
Medical screening examination/treatment/procedure(s) were performed by non-physician practitioner and as supervising physician I was immediately available for consultation/collaboration.    Dorie Rank, MD 01/25/14 [phone]

## 2014-01-25 NOTE — ED Notes (Signed)
Pt c/o left shoulder and elbow pain x 2 days, denies injury thinks he overused it.

## 2014-01-26 ENCOUNTER — Telehealth (HOSPITAL_BASED_OUTPATIENT_CLINIC_OR_DEPARTMENT_OTHER): Payer: Self-pay | Admitting: Emergency Medicine

## 2014-01-26 ENCOUNTER — Encounter (HOSPITAL_BASED_OUTPATIENT_CLINIC_OR_DEPARTMENT_OTHER): Payer: Self-pay | Admitting: Emergency Medicine

## 2015-06-23 ENCOUNTER — Encounter: Payer: Self-pay | Admitting: Internal Medicine

## 2015-06-23 ENCOUNTER — Telehealth: Payer: Self-pay | Admitting: Gastroenterology

## 2015-09-08 ENCOUNTER — Other Ambulatory Visit: Payer: Self-pay | Admitting: Gastroenterology

## 2015-09-08 DIAGNOSIS — R197 Diarrhea, unspecified: Secondary | ICD-10-CM

## 2015-09-16 ENCOUNTER — Other Ambulatory Visit: Payer: Medicare Other

## 2019-12-20 ENCOUNTER — Other Ambulatory Visit: Payer: Self-pay

## 2019-12-20 ENCOUNTER — Emergency Department (HOSPITAL_COMMUNITY)
Admission: EM | Admit: 2019-12-20 | Discharge: 2019-12-20 | Disposition: A | Discharge status: Home / Self Care | Payer: Medicare Other | Attending: Emergency Medicine | Admitting: Emergency Medicine

## 2019-12-20 ENCOUNTER — Emergency Department (HOSPITAL_COMMUNITY): Payer: Medicare Other

## 2019-12-20 DIAGNOSIS — M25512 Pain in left shoulder: Secondary | ICD-10-CM | POA: Insufficient documentation

## 2019-12-20 DIAGNOSIS — M25562 Pain in left knee: Secondary | ICD-10-CM | POA: Insufficient documentation

## 2019-12-20 DIAGNOSIS — W010XXA Fall on same level from slipping, tripping and stumbling without subsequent striking against object, initial encounter: Secondary | ICD-10-CM | POA: Diagnosis not present

## 2019-12-20 DIAGNOSIS — S8992XA Unspecified injury of left lower leg, initial encounter: Secondary | ICD-10-CM | POA: Diagnosis not present

## 2019-12-20 DIAGNOSIS — R52 Pain, unspecified: Secondary | ICD-10-CM | POA: Diagnosis not present

## 2019-12-20 DIAGNOSIS — S8392XA Sprain of unspecified site of left knee, initial encounter: Secondary | ICD-10-CM | POA: Diagnosis not present

## 2019-12-20 DIAGNOSIS — Z743 Need for continuous supervision: Secondary | ICD-10-CM | POA: Diagnosis not present

## 2019-12-20 DIAGNOSIS — S4992XA Unspecified injury of left shoulder and upper arm, initial encounter: Secondary | ICD-10-CM | POA: Diagnosis not present

## 2019-12-20 DIAGNOSIS — S43402A Unspecified sprain of left shoulder joint, initial encounter: Secondary | ICD-10-CM | POA: Diagnosis not present

## 2019-12-20 DIAGNOSIS — R0902 Hypoxemia: Secondary | ICD-10-CM | POA: Diagnosis not present

## 2019-12-20 DIAGNOSIS — M19012 Primary osteoarthritis, left shoulder: Secondary | ICD-10-CM | POA: Diagnosis not present

## 2019-12-20 MED ORDER — ACETAMINOPHEN 500 MG PO TABS
1000.0000 mg | ORAL_TABLET | Freq: Once | ORAL | Status: AC
  Administered 2019-12-20: 1000 mg via ORAL
  Filled 2019-12-20: qty 2

## 2019-12-20 NOTE — Discharge Instructions (Addendum)
It was our pleasure to provide your ER care today - we hope that you feel better.  Icepack to sore area.  Take acetaminophen or ibuprofen as need for pain.   Although your xrays do not show any acute fracture, there is the possibility of ligament injury and/or other soft tissue injury.   Follow up with orthopedist in 1-2 weeks if symptoms fail to improve/resolve.  Return to ER if worse, new symptoms, worsening or severe pain, numbness/weakness, or other concern.
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
## 2020-03-28 ENCOUNTER — Inpatient Hospital Stay (HOSPITAL_COMMUNITY): Payer: Medicare Other

## 2020-03-28 ENCOUNTER — Emergency Department (HOSPITAL_COMMUNITY): Payer: Medicare Other

## 2020-03-28 ENCOUNTER — Encounter (HOSPITAL_COMMUNITY): Payer: Self-pay

## 2020-03-28 ENCOUNTER — Encounter (HOSPITAL_COMMUNITY): Payer: Medicare Other

## 2020-03-28 ENCOUNTER — Inpatient Hospital Stay (HOSPITAL_COMMUNITY)
Admission: EM | Admit: 2020-03-28 | Discharge: 2020-04-01 | DRG: 062 | Disposition: A | Discharge status: Skilled Nursing Facility | Payer: Medicare Other | Attending: Neurology | Admitting: Neurology

## 2020-03-28 DIAGNOSIS — J439 Emphysema, unspecified: Secondary | ICD-10-CM | POA: Diagnosis present

## 2020-03-28 DIAGNOSIS — J449 Chronic obstructive pulmonary disease, unspecified: Secondary | ICD-10-CM | POA: Diagnosis present

## 2020-03-28 DIAGNOSIS — I69354 Hemiplegia and hemiparesis following cerebral infarction affecting left non-dominant side: Secondary | ICD-10-CM | POA: Diagnosis not present

## 2020-03-28 DIAGNOSIS — Z7401 Bed confinement status: Secondary | ICD-10-CM | POA: Diagnosis not present

## 2020-03-28 DIAGNOSIS — F1020 Alcohol dependence, uncomplicated: Secondary | ICD-10-CM | POA: Diagnosis present

## 2020-03-28 DIAGNOSIS — Z860100 Personal history of colon polyps, unspecified: Secondary | ICD-10-CM

## 2020-03-28 DIAGNOSIS — Z86718 Personal history of other venous thrombosis and embolism: Secondary | ICD-10-CM | POA: Diagnosis not present

## 2020-03-28 DIAGNOSIS — H532 Diplopia: Secondary | ICD-10-CM | POA: Diagnosis present

## 2020-03-28 DIAGNOSIS — R29712 NIHSS score 12: Secondary | ICD-10-CM | POA: Diagnosis present

## 2020-03-28 DIAGNOSIS — Z89022 Acquired absence of left finger(s): Secondary | ICD-10-CM | POA: Diagnosis not present

## 2020-03-28 DIAGNOSIS — R29818 Other symptoms and signs involving the nervous system: Secondary | ICD-10-CM | POA: Diagnosis not present

## 2020-03-28 DIAGNOSIS — Z91041 Radiographic dye allergy status: Secondary | ICD-10-CM | POA: Diagnosis not present

## 2020-03-28 DIAGNOSIS — R471 Dysarthria and anarthria: Secondary | ICD-10-CM | POA: Diagnosis not present

## 2020-03-28 DIAGNOSIS — Z743 Need for continuous supervision: Secondary | ICD-10-CM | POA: Diagnosis not present

## 2020-03-28 DIAGNOSIS — I6381 Other cerebral infarction due to occlusion or stenosis of small artery: Secondary | ICD-10-CM | POA: Diagnosis not present

## 2020-03-28 DIAGNOSIS — I639 Cerebral infarction, unspecified: Secondary | ICD-10-CM | POA: Diagnosis not present

## 2020-03-28 DIAGNOSIS — G8194 Hemiplegia, unspecified affecting left nondominant side: Secondary | ICD-10-CM | POA: Diagnosis present

## 2020-03-28 DIAGNOSIS — I10 Essential (primary) hypertension: Secondary | ICD-10-CM | POA: Diagnosis present

## 2020-03-28 DIAGNOSIS — K118 Other diseases of salivary glands: Secondary | ICD-10-CM | POA: Diagnosis not present

## 2020-03-28 DIAGNOSIS — H9193 Unspecified hearing loss, bilateral: Secondary | ICD-10-CM | POA: Diagnosis present

## 2020-03-28 DIAGNOSIS — M1990 Unspecified osteoarthritis, unspecified site: Secondary | ICD-10-CM | POA: Diagnosis present

## 2020-03-28 DIAGNOSIS — Z79899 Other long term (current) drug therapy: Secondary | ICD-10-CM

## 2020-03-28 DIAGNOSIS — Z20822 Contact with and (suspected) exposure to covid-19: Secondary | ICD-10-CM | POA: Diagnosis present

## 2020-03-28 DIAGNOSIS — Z7901 Long term (current) use of anticoagulants: Secondary | ICD-10-CM | POA: Diagnosis not present

## 2020-03-28 DIAGNOSIS — E785 Hyperlipidemia, unspecified: Secondary | ICD-10-CM | POA: Diagnosis present

## 2020-03-28 DIAGNOSIS — F17210 Nicotine dependence, cigarettes, uncomplicated: Secondary | ICD-10-CM | POA: Diagnosis not present

## 2020-03-28 DIAGNOSIS — K219 Gastro-esophageal reflux disease without esophagitis: Secondary | ICD-10-CM | POA: Diagnosis present

## 2020-03-28 DIAGNOSIS — M797 Fibromyalgia: Secondary | ICD-10-CM | POA: Diagnosis not present

## 2020-03-28 DIAGNOSIS — I6389 Other cerebral infarction: Secondary | ICD-10-CM

## 2020-03-28 DIAGNOSIS — E43 Unspecified severe protein-calorie malnutrition: Secondary | ICD-10-CM | POA: Diagnosis not present

## 2020-03-28 DIAGNOSIS — Z8249 Family history of ischemic heart disease and other diseases of the circulatory system: Secondary | ICD-10-CM | POA: Diagnosis not present

## 2020-03-28 DIAGNOSIS — R278 Other lack of coordination: Secondary | ICD-10-CM | POA: Diagnosis not present

## 2020-03-28 DIAGNOSIS — H9192 Unspecified hearing loss, left ear: Secondary | ICD-10-CM | POA: Diagnosis not present

## 2020-03-28 DIAGNOSIS — Z9049 Acquired absence of other specified parts of digestive tract: Secondary | ICD-10-CM | POA: Diagnosis not present

## 2020-03-28 DIAGNOSIS — F32A Depression, unspecified: Secondary | ICD-10-CM | POA: Diagnosis not present

## 2020-03-28 DIAGNOSIS — M2550 Pain in unspecified joint: Secondary | ICD-10-CM | POA: Diagnosis not present

## 2020-03-28 DIAGNOSIS — R2681 Unsteadiness on feet: Secondary | ICD-10-CM | POA: Diagnosis not present

## 2020-03-28 DIAGNOSIS — Z833 Family history of diabetes mellitus: Secondary | ICD-10-CM

## 2020-03-28 DIAGNOSIS — R29810 Facial weakness: Secondary | ICD-10-CM | POA: Diagnosis not present

## 2020-03-28 DIAGNOSIS — Z85038 Personal history of other malignant neoplasm of large intestine: Secondary | ICD-10-CM | POA: Diagnosis not present

## 2020-03-28 DIAGNOSIS — Z9889 Other specified postprocedural states: Secondary | ICD-10-CM | POA: Diagnosis not present

## 2020-03-28 DIAGNOSIS — Z800 Family history of malignant neoplasm of digestive organs: Secondary | ICD-10-CM

## 2020-03-28 DIAGNOSIS — I63521 Cerebral infarction due to unspecified occlusion or stenosis of right anterior cerebral artery: Secondary | ICD-10-CM | POA: Diagnosis not present

## 2020-03-28 DIAGNOSIS — M6281 Muscle weakness (generalized): Secondary | ICD-10-CM | POA: Diagnosis not present

## 2020-03-28 DIAGNOSIS — R5381 Other malaise: Secondary | ICD-10-CM | POA: Diagnosis not present

## 2020-03-28 DIAGNOSIS — R0789 Other chest pain: Secondary | ICD-10-CM | POA: Diagnosis not present

## 2020-03-28 DIAGNOSIS — G8190 Hemiplegia, unspecified affecting unspecified side: Secondary | ICD-10-CM | POA: Diagnosis not present

## 2020-03-28 DIAGNOSIS — R531 Weakness: Secondary | ICD-10-CM | POA: Diagnosis not present

## 2020-03-28 DIAGNOSIS — G319 Degenerative disease of nervous system, unspecified: Secondary | ICD-10-CM | POA: Diagnosis not present

## 2020-03-28 DIAGNOSIS — F1010 Alcohol abuse, uncomplicated: Secondary | ICD-10-CM

## 2020-03-28 DIAGNOSIS — R079 Chest pain, unspecified: Secondary | ICD-10-CM | POA: Diagnosis not present

## 2020-03-28 DIAGNOSIS — Z803 Family history of malignant neoplasm of breast: Secondary | ICD-10-CM

## 2020-03-28 LAB — CBG MONITORING, ED: Glucose-Capillary: 123 mg/dL — ABNORMAL HIGH (ref 70–99)

## 2020-03-28 LAB — RAPID URINE DRUG SCREEN, HOSP PERFORMED
Amphetamines: NOT DETECTED
Barbiturates: NOT DETECTED
Benzodiazepines: NOT DETECTED
Cocaine: NOT DETECTED
Opiates: NOT DETECTED
Tetrahydrocannabinol: NOT DETECTED

## 2020-03-28 LAB — DIFFERENTIAL
Abs Immature Granulocytes: 0.04 10*3/uL (ref 0.00–0.07)
Basophils Absolute: 0.1 10*3/uL (ref 0.0–0.1)
Basophils Relative: 1 %
Eosinophils Absolute: 0.4 10*3/uL (ref 0.0–0.5)
Eosinophils Relative: 4 %
Immature Granulocytes: 1 %
Lymphocytes Relative: 22 %
Lymphs Abs: 1.9 10*3/uL (ref 0.7–4.0)
Monocytes Absolute: 1 10*3/uL (ref 0.1–1.0)
Monocytes Relative: 12 %
Neutro Abs: 5.1 10*3/uL (ref 1.7–7.7)
Neutrophils Relative %: 60 %

## 2020-03-28 LAB — I-STAT CHEM 8, ED
BUN: 5 mg/dL — ABNORMAL LOW (ref 8–23)
Calcium, Ion: 1.17 mmol/L (ref 1.15–1.40)
Chloride: 105 mmol/L (ref 98–111)
Creatinine, Ser: 0.9 mg/dL (ref 0.61–1.24)
Glucose, Bld: 119 mg/dL — ABNORMAL HIGH (ref 70–99)
HCT: 48 % (ref 39.0–52.0)
Hemoglobin: 16.3 g/dL (ref 13.0–17.0)
Potassium: 3.9 mmol/L (ref 3.5–5.1)
Sodium: 143 mmol/L (ref 135–145)
TCO2: 23 mmol/L (ref 22–32)

## 2020-03-28 LAB — COMPREHENSIVE METABOLIC PANEL
ALT: 50 U/L — ABNORMAL HIGH (ref 0–44)
AST: 38 U/L (ref 15–41)
Albumin: 3.8 g/dL (ref 3.5–5.0)
Alkaline Phosphatase: 52 U/L (ref 38–126)
Anion gap: 10 (ref 5–15)
BUN: 6 mg/dL — ABNORMAL LOW (ref 8–23)
CO2: 24 mmol/L (ref 22–32)
Calcium: 9.3 mg/dL (ref 8.9–10.3)
Chloride: 106 mmol/L (ref 98–111)
Creatinine, Ser: 0.94 mg/dL (ref 0.61–1.24)
GFR, Estimated: >60 mL/min (ref >60)
Glucose, Bld: 123 mg/dL — ABNORMAL HIGH (ref 70–99)
Potassium: 3.9 mmol/L (ref 3.5–5.1)
Sodium: 140 mmol/L (ref 135–145)
Total Bilirubin: 0.8 mg/dL (ref 0.3–1.2)
Total Protein: 6.9 g/dL (ref 6.5–8.1)

## 2020-03-28 LAB — RESPIRATORY PANEL BY RT PCR (FLU A&B, COVID)
Influenza A by PCR: NEGATIVE
Influenza B by PCR: NEGATIVE
SARS Coronavirus 2 by RT PCR: NEGATIVE

## 2020-03-28 LAB — PROTIME-INR
INR: 1 (ref 0.8–1.2)
Prothrombin Time: 12.8 seconds (ref 11.4–15.2)

## 2020-03-28 LAB — ECHOCARDIOGRAM COMPLETE
Area-P 1/2: 2.83 cm2
S' Lateral: 2.7 cm
Weight: 2592.61 oz

## 2020-03-28 LAB — LIPID PANEL
Cholesterol: 231 mg/dL — ABNORMAL HIGH (ref 0–200)
HDL: 51 mg/dL (ref >40)
LDL Cholesterol: 165 mg/dL — ABNORMAL HIGH (ref 0–99)
Total CHOL/HDL Ratio: 4.5 RATIO
Triglycerides: 74 mg/dL (ref <150)
VLDL: 15 mg/dL (ref 0–40)

## 2020-03-28 LAB — MRSA PCR SCREENING: MRSA by PCR: NEGATIVE

## 2020-03-28 LAB — ETHANOL: Alcohol, Ethyl (B): <10 mg/dL (ref <10)

## 2020-03-28 LAB — CBC
HCT: 48.8 % (ref 39.0–52.0)
HCT: 47.8 % (ref 39.0–52.0)
Hemoglobin: 16.7 g/dL (ref 13.0–17.0)
Hemoglobin: 16.3 g/dL (ref 13.0–17.0)
MCH: 32.3 pg (ref 26.0–34.0)
MCH: 32.1 pg (ref 26.0–34.0)
MCHC: 34.2 g/dL (ref 30.0–36.0)
MCHC: 34.1 g/dL (ref 30.0–36.0)
MCV: 94.4 fL (ref 80.0–100.0)
MCV: 94.1 fL (ref 80.0–100.0)
Platelets: 371 10*3/uL (ref 150–400)
Platelets: 399 10*3/uL (ref 150–400)
RBC: 5.17 MIL/uL (ref 4.22–5.81)
RBC: 5.08 MIL/uL (ref 4.22–5.81)
RDW: 11.5 % (ref 11.5–15.5)
RDW: 11.6 % (ref 11.5–15.5)
WBC: 8.4 10*3/uL (ref 4.0–10.5)
WBC: 11.6 10*3/uL — ABNORMAL HIGH (ref 4.0–10.5)
nRBC: 0 % (ref 0.0–0.2)
nRBC: 0 % (ref 0.0–0.2)

## 2020-03-28 LAB — PHOSPHORUS: Phosphorus: 2.9 mg/dL (ref 2.5–4.6)

## 2020-03-28 LAB — APTT: aPTT: 26 seconds (ref 24–36)

## 2020-03-28 LAB — HIV ANTIBODY (ROUTINE TESTING W REFLEX): HIV Screen 4th Generation wRfx: NONREACTIVE

## 2020-03-28 LAB — MAGNESIUM: Magnesium: 1.7 mg/dL (ref 1.7–2.4)

## 2020-03-28 MED ORDER — NICARDIPINE HCL IN NACL 20-0.86 MG/200ML-% IV SOLN
0.0000 mg/h | INTRAVENOUS | Status: DC
  Filled 2020-03-28: qty 200

## 2020-03-28 MED ORDER — THIAMINE HCL 100 MG PO TABS
100.0000 mg | ORAL_TABLET | Freq: Every day | ORAL | Status: DC
  Administered 2020-03-28 – 2020-04-01 (×5): 100 mg via ORAL
  Filled 2020-03-28 (×5): qty 1

## 2020-03-28 MED ORDER — UMECLIDINIUM BROMIDE 62.5 MCG/INH IN AEPB
1.0000 | INHALATION_SPRAY | Freq: Two times a day (BID) | RESPIRATORY_TRACT | Status: DC
  Administered 2020-03-29 – 2020-04-01 (×7): 1 via RESPIRATORY_TRACT
  Filled 2020-03-28: qty 7

## 2020-03-28 MED ORDER — ADULT MULTIVITAMIN W/MINERALS CH
1.0000 | ORAL_TABLET | Freq: Every day | ORAL | Status: DC
  Administered 2020-03-28 – 2020-04-01 (×5): 1 via ORAL
  Filled 2020-03-28 (×5): qty 1

## 2020-03-28 MED ORDER — SODIUM CHLORIDE 0.9 % IV SOLN: INTRAVENOUS | Status: DC

## 2020-03-28 MED ORDER — GADOBUTROL 1 MMOL/ML IV SOLN
7.0000 mL | Freq: Once | INTRAVENOUS | Status: AC | PRN
  Administered 2020-03-28: 7 mL via INTRAVENOUS

## 2020-03-28 MED ORDER — ALTEPLASE (STROKE) FULL DOSE INFUSION
0.9000 mg/kg | Freq: Once | INTRAVENOUS | Status: AC
  Administered 2020-03-28: 66.2 mg via INTRAVENOUS
  Filled 2020-03-28: qty 100

## 2020-03-28 MED ORDER — ACETAMINOPHEN 325 MG PO TABS: 650.0000 mg | ORAL_TABLET | ORAL | Status: DC | PRN

## 2020-03-28 MED ORDER — ACETAMINOPHEN 650 MG RE SUPP: 650.0000 mg | RECTAL | Status: DC | PRN

## 2020-03-28 MED ORDER — ACETAMINOPHEN 160 MG/5ML PO SOLN: 650.0000 mg | ORAL | Status: DC | PRN

## 2020-03-28 MED ORDER — CHLORHEXIDINE GLUCONATE CLOTH 2 % EX PADS
6.0000 | MEDICATED_PAD | Freq: Every day | CUTANEOUS | Status: DC
  Administered 2020-03-29 – 2020-04-01 (×4): 6 via TOPICAL

## 2020-03-28 MED ORDER — STROKE: EARLY STAGES OF RECOVERY BOOK: Freq: Once | Status: AC

## 2020-03-28 MED ORDER — TIOTROPIUM BROMIDE MONOHYDRATE 18 MCG IN CAPS: 18.0000 ug | ORAL_CAPSULE | Freq: Two times a day (BID) | RESPIRATORY_TRACT | Status: DC

## 2020-03-28 MED ORDER — FOLIC ACID 1 MG PO TABS
1.0000 mg | ORAL_TABLET | Freq: Every day | ORAL | Status: DC
  Administered 2020-03-28 – 2020-04-01 (×5): 1 mg via ORAL
  Filled 2020-03-28 (×5): qty 1

## 2020-03-28 MED ORDER — PANTOPRAZOLE SODIUM 40 MG IV SOLR
40.0000 mg | Freq: Every day | INTRAVENOUS | Status: DC
  Filled 2020-03-28: qty 40

## 2020-03-28 MED ORDER — UMECLIDINIUM BROMIDE 62.5 MCG/INH IN AEPB
1.0000 | INHALATION_SPRAY | Freq: Two times a day (BID) | RESPIRATORY_TRACT | Status: DC
  Filled 2020-03-28: qty 7

## 2020-03-28 MED ORDER — SODIUM CHLORIDE 0.9 % IV SOLN
50.0000 mL | Freq: Once | INTRAVENOUS | Status: AC
  Administered 2020-03-28: 50 mL via INTRAVENOUS

## 2020-03-28 MED ORDER — THIAMINE HCL 100 MG/ML IJ SOLN
100.0000 mg | Freq: Every day | INTRAMUSCULAR | Status: DC
  Filled 2020-03-28: qty 2

## 2020-03-28 MED ORDER — SENNOSIDES-DOCUSATE SODIUM 8.6-50 MG PO TABS: 1.0000 | ORAL_TABLET | Freq: Every evening | ORAL | Status: DC | PRN

## 2020-03-28 NOTE — ED Notes (Signed)
Pt left from CT and went straight to MRI. Pt is still receving alteplase but neuro assessment unable to be performed due to pt having scanning done.

## 2020-03-28 NOTE — ED Provider Notes (Signed)
[facility] EMERGENCY DEPARTMENT Provider Note   CSN: [id number] Arrival date & time: 03/28/20  0401   History Chief complaint: Code stroke  [name] is a 64 y.o. male.  The history is provided by the patient and the EMS personnel.  He has history of alcoholism, hypertension, hyperlipidemia, COPD, DVT, portal vein thrombosis but is not on any anticoagulants.  He was brought in by EMS as a code stroke.  He was last known normal at about midnight sleep.  Woke up at 2 AM, he noted that he could not get out of bed because his left side was weak called for EMS.  He is noted to have dense left-sided weakness.  He is complaining of a bifrontal headache.  Past Medical History:  Diagnosis Date  . Abdominal pain   . Alcoholism   . Arthritis   . COPD (chronic obstructive pulmonary disease)   . Depression   . Diarrhea   . Emphysema   . Fibromyalgia   . Fibromyalgia 1996  . GERD (gastroesophageal reflux disease)   . Gum disease   . Hearing loss   . Hx of blood clots   . Hx of colonic polyps 11/30/2010   tubular adenomas; had prior colectomy  . Hyperlipidemia   . Hypertension   . IBS (irritable bowel syndrome)   . Nausea   . Night sweats   . Vomiting     Patient Active Problem List   Diagnosis Date Noted  . Abdominal pain, periumbilic 62/94/7654  . Dyspepsia and other specified disorders of function of stomach 10/03/2011  . DVT (deep venous thrombosis) ([place]) 04/10/2011  . 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  . Diarrhea 11/29/2010  . Esophageal reflux 11/29/2010  . Personal history of colonic polyps 11/29/2010    Past Surgical History:  Procedure Laterality Date  . APPENDECTOMY    . COLECTOMY     inguinal rectal colectomy  . COLONOSCOPY    . FOOT SURGERY     left  . HAND SURGERY     for left finger amputations  . HEMICOLECTOMY  01/24/11  . NOSE SURGERY    . POLYPECTOMY    . PROCTOSCOPY  01/24/11       Family History  Problem Relation Age of  Onset  . Hypertension Mother   . Diabetes Mother   . Breast cancer Mother   . Breast cancer Sister   . Stomach cancer Maternal Grandfather   . Hypertension Sister   . Hypertension Sister   . Colon cancer Neg Hx     Social History   Tobacco Use  . Smoking status: Current Every Day Smoker    Packs/day: 1.00    Types: Cigarettes  . Smokeless tobacco: Never Used  Substance Use Topics  . Alcohol use: No    Comment: no alcohol since 01-07-13  . Drug use: No    Home Medications Prior to Admission medications   Medication Sig Start Date End Date Taking? Authorizing Provider  HYDROcodone-acetaminophen (NORCO/VICODIN) 5-325 MG per tablet Take 1-2 tablets by mouth every 4 (four) hours as needed for moderate pain or severe pain. 01/25/14   Montine Circle, PA-C  ibuprofen (ADVIL,MOTRIN) 600 MG tablet Take 1 tablet (600 mg total) by mouth every 6 (six) hours as needed. 01/25/14   Montine Circle, PA-C  tiotropium (SPIRIVA) 18 MCG inhalation capsule Place 18 mcg into inhaler and inhale 2 (two) times daily.     [provider]    Allergies  Iohexol  Review of Systems   Review of Systems  All other systems reviewed and are negative.   Physical Exam Updated Vital Signs BP (!) 151/97 (BP Location: Right Arm)   Pulse 86   Resp (!) 22   Wt 73.5 kg   SpO2 100%   BMI 22.60 kg/m   Physical Exam Vitals and nursing note reviewed.   64 year old male, resting comfortably and in no acute distress. Vital signs are significant for elevated blood pressure and slightly elevated respiratory rate. Oxygen saturation is 100%, which is normal. Head is normocephalic and atraumatic. PERRLA, EOMI. Oropharynx is clear. Neck is nontender and supple without adenopathy or JVD. Back is nontender and there is no CVA tenderness. Lungs are clear without rales, wheezes, or rhonchi. Chest is nontender. Heart has regular rate and rhythm without murmur. Abdomen is soft, flat, nontender without  masses or hepatosplenomegaly and peristalsis is normoactive. Extremities have no cyanosis or edema, full range of motion is present. Skin is warm and dry without rash. Neurologic: Awake and alert, will follow commands.  Dense left hemiparesis and left-sided facial droop present.  Plantar response is flexor bilaterally.  ED Results / Procedures / Treatments   Labs (all labs ordered are listed, but only abnormal results are displayed) Labs Reviewed  COMPREHENSIVE METABOLIC PANEL - Abnormal; Notable for the following components:      Result Value   Glucose, Bld 123 (*)    BUN 6 (*)    ALT 50 (*)    All other components within normal limits  CBG MONITORING, ED - Abnormal; Notable for the following components:   Glucose-Capillary 123 (*)    All other components within normal limits  I-STAT CHEM 8, ED - Abnormal; Notable for the following components:   BUN 5 (*)    Glucose, Bld 119 (*)    All other components within normal limits  RESPIRATORY PANEL BY RT PCR (FLU A&B, COVID)  ETHANOL  PROTIME-INR  APTT  CBC  DIFFERENTIAL  RAPID URINE DRUG SCREEN, HOSP PERFORMED  URINALYSIS, ROUTINE W REFLEX MICROSCOPIC  HIV ANTIBODY (ROUTINE TESTING W REFLEX)  MAGNESIUM  PHOSPHORUS  CBC  HEMOGLOBIN A1C  LIPID PANEL    EKG EKG Interpretation  Date/Time:  Monday March 28 2020 05:54:05 EDT Ventricular Rate:  77 PR Interval:    QRS Duration: 86 QT Interval:  392 QTC Calculation: 444 R Axis:   36 Text Interpretation: Sinus rhythm Borderline low voltage, extremity leads When compared with ECG of 08/25/2012, No significant change was found Confirmed by Delora Fuel ([id number]) on 03/28/2020 6:08:18 AM   Radiology CT HEAD WO CONTRAST  Result Date: 03/28/2020 CLINICAL DATA:  Stroke, follow-up. EXAM: CT HEAD WITHOUT CONTRAST TECHNIQUE: Contiguous axial images were obtained from the base of the skull through the vertex without intravenous contrast. COMPARISON:  MRI/MRA head and MRA neck 03/28/2020.  Noncontrast head CT 03/28/2020. FINDINGS: Brain: Mild generalized cerebral atrophy. A known acute perforator infarct within the right corona radiata/basal ganglia was better delineated on the brain MRI performed earlier the same day. Redemonstrated chronic lacunar infarct within the right pons. There is no acute intracranial hemorrhage. No extra-axial fluid collection. No evidence of intracranial mass. No midline shift. Vascular: No hyperdense vessel. Skull: Normal. Negative for fracture or focal lesion. Sinuses/Orbits: Visualized orbits show no acute finding. 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 Postsurgical appearance of the paranasal sinuses. No significant paranasal sinus disease or mastoid effusion  at the imaged levels. IMPRESSION: Known acute perforator infarct affecting the right corona radiata/basal ganglia, better appreciated on the same-day brain MRI. Redemonstrated chronic lacunar infarct within the right pons. No evidence of acute intracranial hemorrhage. Electronically Signed   By: Kellie Simmering DO   On: 03/28/2020 07:22   MR ANGIO HEAD WO CONTRAST  Result Date: 03/28/2020 CLINICAL DATA:  Acute stroke suspected EXAM: MR HEAD WITHOUT CONTRAST MR CIRCLE OF WILLIS WITHOUT CONTRAST MRA OF THE NECK WITHOUT AND WITH CONTRAST TECHNIQUE: Multiplanar, multiecho pulse sequences of the brain, circle of willis and surrounding structures were obtained without intravenous contrast. Angiographic images of the neck were obtained using MRA technique without and with intravenous contrast. CONTRAST:  24mL GADAVIST GADOBUTROL 1 MMOL/ML IV SOLN COMPARISON:  Code stroke CT from earlier today FINDINGS: MR HEAD FINDINGS Brain: Wedge of restricted diffusion traversing the posterior right putamen, corona radiata, and caudate body. The restriction is somewhat subtle and likely hyperacute. Remote lacunar infarct at the right pons.  Mild cortical atrophy. No hemorrhage, hydrocephalus, or masslike finding. Vascular: Normal flow voids.  MRA below Skull and upper cervical spine: Normal marrow signal Sinuses/Orbits: Probable prior maxillary antrostomies. No sinusitis. MR CIRCLE OF WILLIS FINDINGS Symmetric carotid and vertebral artery size. No major vessel occlusion, stenosis, or beading. Negative for aneurysm or signs of vascular malformation. Smooth appearance of the right M1 segment. Mild atheromatous change to medium size branches. MRA NECK FINDINGS By time-of-flight there is antegrade flow in both carotid and vertebral arteries. 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 Normal appearance of the arch with 3 vessel branching. The carotid arteries are widely patent. The carotids show a low branching pattern. The codominant vertebral arteries are smooth and widely patent when allowing for artifact at the V1 segments. IMPRESSION: Brain MRI: 1. Acute perforator infarct at the right basal ganglia and corona radiata. 2. Remote lacunar infarct at the right pons. Intracranial MRA: No emergent finding.  Mild atheromatous changes. Neck MRA: 1. Negative arterial structures. 2. 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 Electronically Signed   By: Monte Fantasia M.D.   On: 03/28/2020 06:13   MR ANGIO NECK W WO CONTRAST  Result Date: 03/28/2020 CLINICAL DATA:  Acute stroke suspected EXAM: MR HEAD WITHOUT CONTRAST MR CIRCLE OF WILLIS WITHOUT CONTRAST MRA OF THE NECK WITHOUT AND WITH CONTRAST TECHNIQUE: Multiplanar, multiecho pulse sequences of the brain, circle of willis and surrounding structures were obtained without intravenous contrast. Angiographic images of the neck were obtained using MRA technique without and with intravenous contrast. CONTRAST:  39mL GADAVIST GADOBUTROL 1 MMOL/ML IV SOLN COMPARISON:  Code stroke CT from earlier today FINDINGS: MR HEAD FINDINGS Brain: Wedge of restricted diffusion traversing  the posterior right putamen, corona radiata, and caudate body. The restriction is somewhat subtle and likely hyperacute. Remote lacunar infarct at the right pons. Mild cortical atrophy. No hemorrhage, hydrocephalus, or masslike finding. Vascular: Normal flow voids.  MRA below Skull and upper cervical spine: Normal marrow signal Sinuses/Orbits: Probable prior maxillary antrostomies. No sinusitis. MR CIRCLE OF WILLIS FINDINGS Symmetric carotid and vertebral artery size. No major vessel occlusion, stenosis, or beading. Negative for aneurysm or signs of vascular malformation. Smooth appearance of the right M1 segment. Mild atheromatous change to medium size branches. MRA NECK FINDINGS By time-of-flight there is antegrade flow in both carotid and vertebral arteries. 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 Normal appearance of the arch  with 3 vessel branching. The carotid arteries are widely patent. The carotids show a low branching pattern. The codominant vertebral arteries are smooth and widely patent when allowing for artifact at the V1 segments. IMPRESSION: Brain MRI: 1. Acute perforator infarct at the right basal ganglia and corona radiata. 2. Remote lacunar infarct at the right pons. Intracranial MRA: No emergent finding.  Mild atheromatous changes. Neck MRA: 1. Negative arterial structures. 2. 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 Electronically Signed   By: Monte Fantasia M.D.   On: 03/28/2020 06:13   MR BRAIN WO CONTRAST  Result Date: 03/28/2020 CLINICAL DATA:  Acute stroke suspected EXAM: MR HEAD WITHOUT CONTRAST MR CIRCLE OF WILLIS WITHOUT CONTRAST MRA OF THE NECK WITHOUT AND WITH CONTRAST TECHNIQUE: Multiplanar, multiecho pulse sequences of the brain, circle of willis and surrounding structures were obtained without intravenous contrast. Angiographic images of the neck were obtained using MRA technique without and with intravenous contrast. CONTRAST:  72mL  GADAVIST GADOBUTROL 1 MMOL/ML IV SOLN COMPARISON:  Code stroke CT from earlier today FINDINGS: MR HEAD FINDINGS Brain: Wedge of restricted diffusion traversing the posterior right putamen, corona radiata, and caudate body. The restriction is somewhat subtle and likely hyperacute. Remote lacunar infarct at the right pons. Mild cortical atrophy. No hemorrhage, hydrocephalus, or masslike finding. Vascular: Normal flow voids.  MRA below Skull and upper cervical spine: Normal marrow signal Sinuses/Orbits: Probable prior maxillary antrostomies. No sinusitis. MR CIRCLE OF WILLIS FINDINGS Symmetric carotid and vertebral artery size. No major vessel occlusion, stenosis, or beading. Negative for aneurysm or signs of vascular malformation. Smooth appearance of the right M1 segment. Mild atheromatous change to medium size branches. MRA NECK FINDINGS By time-of-flight there is antegrade flow in both carotid and vertebral arteries. 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 Normal appearance of the arch with 3 vessel branching. The carotid arteries are widely patent. The carotids show a low branching pattern. The codominant vertebral arteries are smooth and widely patent when allowing for artifact at the V1 segments. IMPRESSION: Brain MRI: 1. Acute perforator infarct at the right basal ganglia and corona radiata. 2. Remote lacunar infarct at the right pons. Intracranial MRA: No emergent finding.  Mild atheromatous changes. Neck MRA: 1. Negative arterial structures. 2. 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 Electronically Signed   By: Monte Fantasia M.D.   On: 03/28/2020 06:13   CT HEAD CODE STROKE WO CONTRAST  Result Date: 03/28/2020 CLINICAL DATA:  Code stroke.  Left-sided facial droop and weakness. EXAM: CT HEAD WITHOUT CONTRAST TECHNIQUE: Contiguous axial images were obtained from the base of the skull through the vertex without intravenous contrast. COMPARISON:  None. FINDINGS:  Brain: No evidence of acute infarction, hemorrhage, hydrocephalus, extra-axial collection or mass lesion/mass effect. Chronic appearing lacunar infarct in the right pons. Vascular: No hyperdense vessel when comparing 1 to another. Mild atheromatous calcification. Skull: Normal. Negative for fracture or focal lesion. Sinuses/Orbits: No acute finding. 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 Other: These results were called by telephone at the time of interpretation on 03/28/2020 at 4:23 am to provider Dr Curly Shores, who verbally acknowledged these results. ASPECTS [facility] Stroke Program Early CT Score) - Ganglionic level infarction (caudate, lentiform nuclei, internal capsule, insula, M1-M3 cortex): 7 - Supraganglionic infarction (M4-M6 cortex): 3 Total score (0-10 with 10 being normal): 10 IMPRESSION: 1. No acute finding.ASPECTS is 10. 2. Chronic appearing lacunar infarct in the right  pons. Electronically Signed   By: Monte Fantasia M.D.   On: 03/28/2020 04:27    Procedures Procedures  CRITICAL CARE Performed by: Delora Fuel Total critical care time: 35 minutes Critical care time was exclusive of separately billable procedures and treating other patients. Critical care was necessary to treat or prevent imminent or life-threatening deterioration. Critical care was time spent personally by me on the following activities: development of treatment plan with patient and/or surrogate as well as nursing, discussions with consultants, evaluation of patient's response to treatment, examination of patient, obtaining history from patient or surrogate, ordering and performing treatments and interventions, ordering and review of laboratory studies, ordering and review of radiographic studies, pulse oximetry and re-evaluation of patient's condition.  Medications Ordered in ED Medications  alteplase (ACTIVASE) 1 mg/mL infusion 66.2 mg (has no administration  in time range)    Followed by  0.9 %  sodium chloride infusion (has no administration in time range)    ED Course  I have reviewed the triage vital signs and the nursing notes.  Pertinent labs & imaging results that were available during my care of the patient were reviewed by me and considered in my medical decision making (see chart for details).  MDM Rules/Calculators/A&P Stroke with significant left-sided deficits.  Patient presents near the end of window for thrombolytic therapy.  CT of head shows no evidence of bleeding.  Patient seen in conjunction with Dr. Curly Shores of neurology service who has elected to initiate tPA therapy.  Old records were reviewed, and he has no relevant recent visits, INR for monitoring of warfarin therapy dating back to 2013, but none recently.  Final Clinical Impression(s) / ED Diagnoses Final diagnoses:  Acute arterial ischemic stroke, multifocal, anterior circulation, right [facility])    Rx / DC Orders ED Discharge Orders    None       Delora Fuel, MD 96/28/36 0730

## 2020-03-28 NOTE — Progress Notes (Signed)
   03/28/20 1753  Clinical Encounter Type  Visited With Patient and family together  Visit Type Initial  Referral From Nurse  Consult/Referral To Chaplain   Pt's nurse paged following up on consult request. Chaplain provided AD education and informed Pt and Pt's relative, Eddie Dibbles, that notary and witnesses would be available during normal business hours. Pt will notify nurse when ready to complete AD.   This note was prepared by Chaplain Resident, Dante Gang, MDiv. Chaplain remains available as needed through the on-call pager: [phone].

## 2020-03-28 NOTE — H&P (Addendum)
Neurology Consultation Reason for Consult: Code stroke for left sided weakness Referring Physician: Delora Fuel   CC: Left sided weakness   History is obtained from: Patient and chart review   HPI: [name] is a 64 y.o. male p/w acute onset left sided weakness / numbness, diplopia on left gaze, hearing loss on the left, with a PMHx significant for HTN, HLD, ongoing tobacco abuse (1 ppd x 35 years), COPD (not on home O2), active alcoholism (reports 6 beers/day, last drink at 10 PM on 10/24), DVT (blood thinners discontinued 2-3 years ago per patient), colon polyps s/p partial colectomy  He reports that he was in his normal state of health yesterday, he drank the last of his typical 6 beers at 10 PM and went to bed at midnight.  When he woke to use the bathroom at 3 AM he found himself unable to get out of bed due to left-sided weakness.  Therefore he activated EMS.  EMS reported borderline oxygenation status at 92% on room air, for which they started him on supplemental oxygen with good response to 98%.  However he is not having any signs of respiratory distress.  His blood pressures were noted to be in the 140s, very mildly elevated blood glucose, and regular rhythm with heart rate in the 80s.  He did have severe and left-sided weakness on their evaluation for which code stroke was activated.  On review of systems he additionally complains of a headache, new lateral diplopia on lateral gaze, mild holocephalic headache, numbness of the left side, acute left-sided severe hearing loss on chronic baseline hearing loss bilaterally; he reports no other similar symptoms in the past, he reports occasional palpitations but no chest pain, shortness of breath, chest pressure; endorses a chronic cough but no new symptoms or worsened sputum production, no fevers or chills; endorses chronic diarrhea, but no blood in his urine or his stool; reports no new swelling of his limbs; reports no change in his baseline  smoking and drinking; reports his only medications are a COPD inhaler and an "over-the-counter prostate pill and over-the-counter colon pill"  After tPA administration, his headache, double vision on left lateral gaze, left-sided hearing loss, left-sided weakness, and left-sided numbness were all rapidly resolving  LKW: midnight tPA given?: Yes 4:25 AM IA performed?: No due to no large vessel occlusion Premorbid modified rankin scale:  0 - 1  Initial NIH stroke scale at the time of admission was 12: Facial paresis-3  Left arm weakness-4  Left leg weakness-3  Sensory loss on the left -1  Dysarthria-1   ROS: A 14 point ROS was performed and is negative except as noted in the HPI.    Past Medical History:  Diagnosis Date  . Abdominal pain   . Alcoholism ([place])   . Arthritis   . COPD (chronic obstructive pulmonary disease) ([place])   . Depression   . Diarrhea   . Emphysema   . Fibromyalgia   . Fibromyalgia 1996  . GERD (gastroesophageal reflux disease)   . Gum disease   . Hearing loss   . Hx of blood clots   . Hx of colonic polyps 11/30/2010   tubular adenomas; had prior colectomy  . Hyperlipidemia   . Hypertension   . IBS (irritable bowel syndrome)   . Nausea   . Night sweats   . Vomiting    Past Surgical History:  Procedure Laterality Date  . APPENDECTOMY    . COLECTOMY     inguinal  rectal colectomy  . COLONOSCOPY    . FOOT SURGERY     left  . HAND SURGERY     for left finger amputations  . HEMICOLECTOMY  01/24/11  . NOSE SURGERY    . POLYPECTOMY    . PROCTOSCOPY  01/24/11   Current Outpatient Medications  Medication Instructions  . HYDROcodone-acetaminophen (NORCO/VICODIN) 5-325 MG per tablet 1-2 tablets, Oral, Every 4 hours PRN  . ibuprofen (ADVIL) 600 mg, Oral, Every 6 hours PRN  . tiotropium (SPIRIVA) 18 mcg, 2 times daily     Family History  Problem Relation Age of Onset  . Hypertension Mother   . Diabetes Mother   . Breast cancer Mother   . Breast  cancer Sister   . Hypertension Sister   . Stomach cancer Maternal Grandfather   . Hypertension Sister   . Colon cancer Neg Hx    Social History:  reports that he has been smoking cigarettes. He has been smoking about 1.00 pack per day. He has never used smokeless tobacco. He reports that he does not drink alcohol and does not use drugs. He reports he lives independently and then jokingly adds "but maybe not for long!"  Exam: Current vital signs: BP 140/80 (BP Location: Right Arm)   Pulse 80   Temp 98.3 F (36.8 C) (Oral)   Resp 18   Wt 73.5 kg   SpO2 92%   BMI 22.60 kg/m  Vital signs in last 24 hours: Temp:  [98.3 F (36.8 C)] 98.3 F (36.8 C) (10/25 0458) Pulse Rate:  [80-86] 80 (10/25 0458) Resp:  [18-22] 18 (10/25 0458) BP: (140-151)/(79-97) 140/80 (10/25 0458) SpO2:  [92 %-100 %] 92 % (10/25 0458) Weight:  [73.5 kg] 73.5 kg (10/25 0459)   Physical Exam  Constitutional: Appears well-developed and well-nourished.  Psych: Affect appropriate to situation, initially anxious but improved mood as his symptoms improved Eyes: No scleral injection HENT: No OP obstruction, poor dentition MSK: no joint deformities, no lower extremity edema Cardiovascular: Normal rate and regular rhythm.  Respiratory: Effort normal, non-labored breathing GI: Soft.  No distension. There is no tenderness. Abdominal scar, well healed. +BS  Skin: warm dry and intact visible skin  Neuro: Mental Status: Patient is awake, alert, oriented to person, place, month, year, and situation. Patient is able to give a clear and coherent history. No signs of aphasia or neglect Cranial Nerves: II: Visual Fields are full. Pupils are equal, round, and reactive to light.  III,IV, VI: EOMI without ptosis; diplopia initially on lateral gaze (likely to the right though he gave varied reports), improved quickly after tPA V: Facial sensation is symmetric to temperature VII: Facial movement is notable for a dense left  facial droop (upper and lower face).  VIII: hearing is intact to voice on the right, substantially worse on the left which he reported was new and improving X: Uvula elevates symmetrically on repeat exam though initial concern for less elevation on the left  XI: Shoulder shrug is assymmetric (cannot elevate left shoulder) but head turn is symmetric bilaterally XII: tongue is midline without atrophy or fasciculations.  Motor: Tone is low on the left arm > left leg compared to the right side. Bulk is normal.  On initial assessment he was completely plegic on the left upper extremity even to noxious stimulation, and was able to wiggle the left toes but was not able to move the left leg antigravity.  Post TPA he was antigravity with the left leg for a  full count of 5 and antigravity with the left upper extremity though there was still some apparent weakness of the left upper extremity Sensory: Sensation is symmetric to light touch in the arms and legs, initially reduced on the left arm and leg  Deep Tendon Reflexes: 2+ and symmetric in the biceps and patellae.  Plantars: Toes are downgoing bilaterally.  Cerebellar: FNF and HKS are intact on the RUE and RLE, too weak on the left to perform prior to TPA; post TPA this was intact in the left within limits for some residual weakness   I have reviewed labs in epic and the results pertinent to this consultation are: Cr 0.94  Normal platelets Negative EtOH  I have reviewed the images obtained: HCT with chronic microvascular changes, no bleed, no clearly acute intracranial process  MRI brain w/ acute right corona radiata stroke,  MRA head w/o LVO; somewhat motion limited      Impression: This is a 64 y.o. man with vascular risk factors of ongoing smoking, HTN, HLD, presenting with acute onset left-sided weakness as well as some initial cranial nerve abnormalities just within the tPA window, now status post tPA.  The stroke demonstrated on MRI is in  the anterior circulation while his initial cranial nerve abnormalities were concerning for brainstem and in particular left labyrinthine artery/AICA dysfunction.  It is unclear why he would have had these brainstem findings, but they were additionally rapidly resolving which makes full characterization challenging. Due to contrast allergy, MRA was obtained emergently to rule out basilar artery occlusion given his initial significant cranial nerve findings. The right corona radiata lesion does explain his left-sided weakness without evidence of cortical signs on examination, and the etiology is most likely small vessel disease.  Nevertheless given transient posterior fossa symptoms simultaneously, will obtain echocardiogram to rule out a central embolic source.     Recommendations:  # Right corona radiata stroke s/p tPA at 4:25 AM on 03/28/2020 - Stroke labs HgbA1c, fasting lipid panel - MRI brain completed as above  - MRA of the brain without contrast and MRA neck w/wo completed as above - Frequent neuro checks per post-tPA protocol - Echocardiogram - Carotid dopplers - Prophylactic therapy-Antiplatelet med: Aspirin - dose 325mg  PO or 300mg  PR, followed by 81 mg daily 24 hours after tPA if patient stable without significant bleed - Consider Plavix 300 mg load with 75 mg daily for 21 - 90 day course 24 hours after tPA if patient stable without significant bleed - Start statin as needed for LDL goal < 70  - Risk factor modification - Telemetry monitoring; 30 day event monitor on discharge if no arrythmias captured given his report of intermittent palpitations - Blood pressure goal   - Post tPA for 24 hours < 180/105 - PT consult, OT consult, Speech consult - Stroke team to follow  # Alcohol abuse  > Last drink 10 PM 10/24 per patient report; no history of prior complicated withdrawal, EtOH level undectable on admission - CIWAS protocol w/o meds ordered at this time   # COPD > No O2 at home,  no symptoms of acute flare - Order home inhalers once confirmed by pharmacy  - Oxygenation goal of 88-92% given his respiratory drive is likely driven by hypoxia and his baseline O2 sat on EMS arrival was 92%  # Incidental parotid mass - Outpatient ENT follow-up  Note, I did attempt to contact the patient's son Mr. Eddie Dibbles Fiscus at [phone] as requested by the patient, but  the phone went to voicemail.  This number had been added for Mr. Rodda in our electronic medical record  Due to the patient's contrast allergy, significant additional time was required to transport the patient to MRI, obtain MRA imaging and review it personally immediately as it was captured.  Thus I spent approximately 95 minutes at the patient's bedside in exclusive care of this patient  Lesleigh Noe MD-PhD [facility] [phone]  Addendum: Unfortunately on 6:45 AM neurological check by nurse patient's NIH stroke scale had again declined. New NIHSS 11 Facial paresis-2 Left arm weakness-4 Left leg weakness-3 Sensory loss on the left -1 Dysarthria-1    Head CT was performed on my recommendation and was negative for acute intracranial hemorrhage.  Patient's blood pressures remained within goal. No change in plan at this time.  Discussed with the patient that waxing/waning symptoms are not uncommon with the type of stroke he has had and we will continue to monitor and hope for improvement

## 2020-03-28 NOTE — Progress Notes (Signed)
Pharmacist Code Stroke Response  Notified to mix tPA at 0417 by Dr. Curly [place] Delivered tPA to RN at [phone]  tPA dose = 6.6mg  bolus over 1 minute followed by 59.6mg  for a total dose of 66.2mg  over 1 hour  Issues/delays encountered (if applicable): Confirmation of last seen normal  Narda Bonds 03/28/20 4:25 AM

## 2020-03-28 NOTE — Progress Notes (Signed)
STROKE TEAM PROGRESS NOTE   INTERVAL HISTORY His son was at the bedside. . I have personally reviewed history of presenting illness with the patient and son, electronic medical records and imaging films in PACS.  He woke up with sudden onset of left hemiparesis and dysarthria and received IV TPA but has not shown significant improvement yet.  Neurological exam is remained stable overnight.  Blood pressure adequately controlled.  Follow-up brain imaging shows large 2 cm right corona radiata infarct on MRI without any hemorrhagic transformation.  Patient also complained of transient horizontal diplopia at the onset of his stroke which appears to have resolved.  MRI also shows an old right pontine infarct of remote age but no acute infarct in the brainstem to explain his diplopia. Son states patient is a heavy alcohol drinker and and will likely going to withdrawal soon Vitals:   03/28/20 0707 03/28/20 0722 03/28/20 0800 03/28/20 0900  BP: (!) 147/97 134/89 139/85 122/87  Pulse: 76 78 84 79  Resp: 16 (!) 23 (!) 25 20  Temp:      TempSrc:      SpO2: 94% 92% 95% 93%  Weight:       CBC:  Recent Labs  Lab 03/28/20 0409 03/28/20 0411  WBC 8.4  --   NEUTROABS 5.1  --   HGB 16.7 16.3  HCT 48.8 48.0  MCV 94.4  --   PLT 371  --    Basic Metabolic Panel:  Recent Labs  Lab 03/28/20 0409 03/28/20 0411  NA 140 143  K 3.9 3.9  CL 106 105  CO2 24  --   GLUCOSE 123* 119*  BUN 6* 5*  CREATININE 0.94 0.90  CALCIUM 9.3  --    Lipid Panel: No results for input(s): CHOL, TRIG, HDL, CHOLHDL, VLDL, LDLCALC in the last 168 hours. HgbA1c: No results for input(s): HGBA1C in the last 168 hours. Urine Drug Screen: No results for input(s): LABOPIA, COCAINSCRNUR, LABBENZ, AMPHETMU, THCU, LABBARB in the last 168 hours.  Alcohol Level  Recent Labs  Lab 03/28/20 0409  ETH <10    IMAGING past 24 hours CT HEAD WO CONTRAST  Result Date: 03/28/2020 CLINICAL DATA:  Stroke, follow-up. EXAM: CT HEAD  WITHOUT CONTRAST TECHNIQUE: Contiguous axial images were obtained from the base of the skull through the vertex without intravenous contrast. COMPARISON:  MRI/MRA head and MRA neck 03/28/2020. Noncontrast head CT 03/28/2020. FINDINGS: Brain: Mild generalized cerebral atrophy. A known acute perforator infarct within the right corona radiata/basal ganglia was better delineated on the brain MRI performed earlier the same day. Redemonstrated chronic lacunar infarct within the right pons. There is no acute intracranial hemorrhage. No extra-axial fluid collection. No evidence of intracranial mass. No midline shift. Vascular: No hyperdense vessel. Skull: Normal. Negative for fracture or focal lesion. Sinuses/Orbits: Visualized orbits show no acute finding. 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 Postsurgical appearance of the paranasal sinuses. No significant paranasal sinus disease or mastoid effusion at the imaged levels. IMPRESSION: Known acute perforator infarct affecting the right corona radiata/basal ganglia, better appreciated on the same-day brain MRI. Redemonstrated chronic lacunar infarct within the right pons. No evidence of acute intracranial hemorrhage. Electronically Signed   By: Kellie Simmering DO   On: 03/28/2020 07:22   MR ANGIO HEAD WO CONTRAST  Result Date: 03/28/2020 CLINICAL DATA:  Acute stroke suspected EXAM: MR HEAD WITHOUT CONTRAST MR CIRCLE OF WILLIS WITHOUT CONTRAST MRA OF THE NECK  WITHOUT AND WITH CONTRAST TECHNIQUE: Multiplanar, multiecho pulse sequences of the brain, circle of willis and surrounding structures were obtained without intravenous contrast. Angiographic images of the neck were obtained using MRA technique without and with intravenous contrast. CONTRAST:  48mL GADAVIST GADOBUTROL 1 MMOL/ML IV SOLN COMPARISON:  Code stroke CT from earlier today FINDINGS: MR HEAD FINDINGS Brain: Wedge of  restricted diffusion traversing the posterior right putamen, corona radiata, and caudate body. The restriction is somewhat subtle and likely hyperacute. Remote lacunar infarct at the right pons. Mild cortical atrophy. No hemorrhage, hydrocephalus, or masslike finding. Vascular: Normal flow voids.  MRA below Skull and upper cervical spine: Normal marrow signal Sinuses/Orbits: Probable prior maxillary antrostomies. No sinusitis. MR CIRCLE OF WILLIS FINDINGS Symmetric carotid and vertebral artery size. No major vessel occlusion, stenosis, or beading. Negative for aneurysm or signs of vascular malformation. Smooth appearance of the right M1 segment. Mild atheromatous change to medium size branches. MRA NECK FINDINGS By time-of-flight there is antegrade flow in both carotid and vertebral arteries. 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 Normal appearance of the arch with 3 vessel branching. The carotid arteries are widely patent. The carotids show a low branching pattern. The codominant vertebral arteries are smooth and widely patent when allowing for artifact at the V1 segments. IMPRESSION: Brain MRI: 1. Acute perforator infarct at the right basal ganglia and corona radiata. 2. Remote lacunar infarct at the right pons. Intracranial MRA: No emergent finding.  Mild atheromatous changes. Neck MRA: 1. Negative arterial structures. 2. 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 Electronically Signed   By: Monte Fantasia M.D.   On: 03/28/2020 06:13   MR ANGIO NECK W WO CONTRAST  Result Date: 03/28/2020 CLINICAL DATA:  Acute stroke suspected EXAM: MR HEAD WITHOUT CONTRAST MR CIRCLE OF WILLIS WITHOUT CONTRAST MRA OF THE NECK WITHOUT AND WITH CONTRAST TECHNIQUE: Multiplanar, multiecho pulse sequences of the brain, circle of willis and surrounding structures were obtained without intravenous contrast. Angiographic images of the neck were obtained using MRA technique without and with  intravenous contrast. CONTRAST:  45mL GADAVIST GADOBUTROL 1 MMOL/ML IV SOLN COMPARISON:  Code stroke CT from earlier today FINDINGS: MR HEAD FINDINGS Brain: Wedge of restricted diffusion traversing the posterior right putamen, corona radiata, and caudate body. The restriction is somewhat subtle and likely hyperacute. Remote lacunar infarct at the right pons. Mild cortical atrophy. No hemorrhage, hydrocephalus, or masslike finding. Vascular: Normal flow voids.  MRA below Skull and upper cervical spine: Normal marrow signal Sinuses/Orbits: Probable prior maxillary antrostomies. No sinusitis. MR CIRCLE OF WILLIS FINDINGS Symmetric carotid and vertebral artery size. No major vessel occlusion, stenosis, or beading. Negative for aneurysm or signs of vascular malformation. Smooth appearance of the right M1 segment. Mild atheromatous change to medium size branches. MRA NECK FINDINGS By time-of-flight there is antegrade flow in both carotid and vertebral arteries. 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 Normal appearance of the arch with 3 vessel branching. The carotid arteries are widely patent. The carotids show a low branching pattern. The codominant vertebral arteries are smooth and widely patent when allowing for artifact at the V1 segments. IMPRESSION: Brain MRI: 1. Acute perforator infarct at the right basal ganglia and corona radiata. 2. Remote lacunar infarct at the right pons. Intracranial MRA: No emergent finding.  Mild atheromatous changes. Neck MRA: 1. Negative arterial structures. 2. 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 Electronically  Signed   By: Monte Fantasia M.D.   On: 03/28/2020 06:13   MR BRAIN WO CONTRAST  Result Date: 03/28/2020 CLINICAL DATA:  Acute stroke suspected EXAM: MR HEAD WITHOUT CONTRAST MR CIRCLE OF WILLIS WITHOUT CONTRAST MRA OF THE NECK WITHOUT AND WITH CONTRAST TECHNIQUE: Multiplanar, multiecho pulse sequences of the brain, circle of  willis and surrounding structures were obtained without intravenous contrast. Angiographic images of the neck were obtained using MRA technique without and with intravenous contrast. CONTRAST:  68mL GADAVIST GADOBUTROL 1 MMOL/ML IV SOLN COMPARISON:  Code stroke CT from earlier today FINDINGS: MR HEAD FINDINGS Brain: Wedge of restricted diffusion traversing the posterior right putamen, corona radiata, and caudate body. The restriction is somewhat subtle and likely hyperacute. Remote lacunar infarct at the right pons. Mild cortical atrophy. No hemorrhage, hydrocephalus, or masslike finding. Vascular: Normal flow voids.  MRA below Skull and upper cervical spine: Normal marrow signal Sinuses/Orbits: Probable prior maxillary antrostomies. No sinusitis. MR CIRCLE OF WILLIS FINDINGS Symmetric carotid and vertebral artery size. No major vessel occlusion, stenosis, or beading. Negative for aneurysm or signs of vascular malformation. Smooth appearance of the right M1 segment. Mild atheromatous change to medium size branches. MRA NECK FINDINGS By time-of-flight there is antegrade flow in both carotid and vertebral arteries. 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 Normal appearance of the arch with 3 vessel branching. The carotid arteries are widely patent. The carotids show a low branching pattern. The codominant vertebral arteries are smooth and widely patent when allowing for artifact at the V1 segments. IMPRESSION: Brain MRI: 1. Acute perforator infarct at the right basal ganglia and corona radiata. 2. Remote lacunar infarct at the right pons. Intracranial MRA: No emergent finding.  Mild atheromatous changes. Neck MRA: 1. Negative arterial structures. 2. 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 Electronically Signed   By: Monte Fantasia M.D.   On: 03/28/2020 06:13   CT HEAD CODE STROKE WO CONTRAST  Result Date: 03/28/2020 CLINICAL DATA:  Code stroke.  Left-sided facial droop  and weakness. EXAM: CT HEAD WITHOUT CONTRAST TECHNIQUE: Contiguous axial images were obtained from the base of the skull through the vertex without intravenous contrast. COMPARISON:  None. FINDINGS: Brain: No evidence of acute infarction, hemorrhage, hydrocephalus, extra-axial collection or mass lesion/mass effect. Chronic appearing lacunar infarct in the right pons. Vascular: No hyperdense vessel when comparing 1 to another. Mild atheromatous calcification. Skull: Normal. Negative for fracture or focal lesion. Sinuses/Orbits: No acute finding. 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 Other: These results were called by telephone at the time of interpretation on 03/28/2020 at 4:23 am to provider Dr Curly Shores, who verbally acknowledged these results. ASPECTS [facility] Stroke Program Early CT Score) - Ganglionic level infarction (caudate, lentiform nuclei, internal capsule, insula, M1-M3 cortex): 7 - Supraganglionic infarction (M4-M6 cortex): 3 Total score (0-10 with 10 being normal): 10 IMPRESSION: 1. No acute finding.ASPECTS is 10. 2. Chronic appearing lacunar infarct in the right pons. Electronically Signed   By: Monte Fantasia M.D.   On: 03/28/2020 04:27    PHYSICAL EXAM Pleasant middle-age Caucasian male not in distress. . Afebrile. Head is nontraumatic. Neck is supple without bruit.    Cardiac exam no murmur or gallop. Lungs are clear to auscultation. Distal pulses are well felt. Neurological Exam : Awake alert oriented x 3 mildly dysarthric speech . Mild left lower face asymmetry. Tongue midline.  Left upper extremity drift.  Marked  weakness of left grip and intrinsic hand muscles.. Orbits right over left upper extremity. Mild left grip weak.  Lower extremity strength is symmetric.Marland Kitchen Normal sensation . Normal coordination.  ASSESSMENT/PLAN [name] is a 64 y.o. male with history of for HTN, HLD, ongoing tobacco abuse (1 ppd x 35  years), COPD (not on home O2), active alcoholism (reports 6 beers/day, last drink at 10 PM on 10/24), DVT (blood thinners discontinued 2-3 years ago per patient), colon polyps s/p partial colectomy presenting with onset left sided weakness / numbness, diplopia on left gaze, hearing loss on the left. Received tPA 03/28/2020 at 0425.   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  Code Stroke CT head No acute abnormality. Chronic R pontine lacune. ASPECTS 10.     MRI  R basal ganglia and corona radiata infarct. Old R pontine lacune.   MRA head Unremarkable   MRA neck Unremarkable vasculature. L parotid mass   Repeat CT head w/ neuro worsening No change     2D Echo pending   LDL pending    HgbA1c pending    VTE prophylaxis - SCDs ordered  No antithrombotic prior to admission, now on No antithrombotic as within 24h of tPA administration. Plan DAPT x 3 weeks then aspirin alone if 24h imaging neg for hemorrhage - patient advised to stop use of home motrin d/t bleeding risk  Therapy recommendations:  pending   Disposition:  pending   Blood Pressure  Home meds:  None, no hx htn BP goal per post tPA protocol x 24h following tPA administration .  Marland Kitchen Long-term BP goal normotensive  Hyperlipidemia  Home meds:  No statin  LDL pending, goal < 70  Add statin based on LDL results   Continue statin at discharge  Other Stroke Risk Factors  Cigarette smoker, advised to stop smoking  ETOH abuse, alcohol level <10, advised to drink no more than 2 drink(s) a day. 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 Dr. Leonie Man discussed with son. Will likely need bx . If surgery needed, unable to have until recovered from stroke.  Strong risk for going into alcohol withdrawal and delirium tremens given history of  heavy drinking.  Hx colon polyps s/p partial colectomy  Hospital day # 0 Continue close neurological monitoring and strict blood pressure control as per post TPA protocol.  Check 24-hour post TPA brain imaging and if negative for bleed start aspirin.  Continue ongoing stroke work-up.  Alcohol withdrawal precautions.  Start thiamine.  Therapy consults.  Long discussion with patient and son and answered questions. This patient is critically ill and at significant risk of neurological worsening, death and care requires constant monitoring of vital signs, hemodynamics,respiratory and cardiac monitoring, extensive review of multiple databases, frequent neurological assessment, discussion with family, other specialists and medical decision making of high complexity.I have made any additions or clarifications directly to the above note.This critical care time does not reflect procedure time, or teaching time or supervisory time of PA/NP/Med Resident etc but could involve care discussion time.  I spent 35 minutes of neurocritical care time  in the care of  this patient.   Antony Contras, MD   To contact Stroke Continuity provider, please refer to [email or web address] After hours, contact General Neurology

## 2020-03-28 NOTE — ED Notes (Signed)
Neuro change... Neurologist paged.
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
## 2020-03-28 NOTE — ED Notes (Signed)
Pt is now in the room from MRI.

## 2020-03-28 NOTE — ED Triage Notes (Signed)
Pt BIB EMS due to stroke-ike s/s. Pt was going to the BR around 3am when he started having dysarthria, left sided arm and leg weakness. Pt also had slurred speech and left sided facial droop. Pt is A&o x4 at the time of arrival.

## 2020-03-28 NOTE — Progress Notes (Signed)
°  Echocardiogram 2D Echocardiogram has been performed.  [name] 03/28/2020, 12:10 PM

## 2020-03-28 NOTE — Code Documentation (Signed)
Stroke Response Nurse Documentation Code Documentation  [name] is a 64 y.o. male arriving to [place] ED via [facility] on 10/25  with past medical hx of ETOH, HTN, HLD . Code stroke was activated by EMS. Patient from home where he was LKW at 0000 and now complaining of HA and left weakness, slurred speech. On No antithrombotic. Stroke team at the bedside on patient arrival. Labs drawn and patient cleared for CT by Dr. Roxanne Mins. Patient to CT with team. NIHSS 12, see documentation for details and code stroke times. Patient with left facial droop, left arm weakness, left leg weakness, left decreased sensation and dysarthria  on exam. The following imaging was completed:  CT, MRA. Patient is a candidate for tPA due to weakness, dysarthria. Bedside handoff with ED RN Marcie Bal.    Madelynn Done  Rapid Response RN

## 2020-03-29 ENCOUNTER — Other Ambulatory Visit: Payer: Self-pay

## 2020-03-29 ENCOUNTER — Inpatient Hospital Stay (HOSPITAL_COMMUNITY): Payer: Medicare Other

## 2020-03-29 LAB — HEMOGLOBIN A1C
Hgb A1c MFr Bld: 5.4 % (ref 4.8–5.6)
Mean Plasma Glucose: 108 mg/dL

## 2020-03-29 MED ORDER — ATORVASTATIN CALCIUM 80 MG PO TABS
80.0000 mg | ORAL_TABLET | Freq: Every day | ORAL | Status: DC
  Administered 2020-03-29 – 2020-04-01 (×4): 80 mg via ORAL
  Filled 2020-03-29 (×4): qty 1

## 2020-03-29 MED ORDER — PNEUMOCOCCAL VAC POLYVALENT 25 MCG/0.5ML IJ INJ
0.5000 mL | INJECTION | INTRAMUSCULAR | Status: DC
  Filled 2020-03-29: qty 0.5

## 2020-03-29 MED ORDER — ENOXAPARIN SODIUM 40 MG/0.4ML ~~LOC~~ SOLN
40.0000 mg | SUBCUTANEOUS | Status: DC
  Administered 2020-03-29 – 2020-03-31 (×3): 40 mg via SUBCUTANEOUS
  Filled 2020-03-29 (×3): qty 0.4

## 2020-03-29 MED ORDER — CLOPIDOGREL BISULFATE 75 MG PO TABS
75.0000 mg | ORAL_TABLET | Freq: Every day | ORAL | Status: DC
  Administered 2020-03-29 – 2020-04-01 (×4): 75 mg via ORAL
  Filled 2020-03-29 (×4): qty 1

## 2020-03-29 MED ORDER — INFLUENZA VAC SPLIT QUAD 0.5 ML IM SUSY
0.5000 mL | PREFILLED_SYRINGE | INTRAMUSCULAR | Status: DC
  Filled 2020-03-29: qty 0.5

## 2020-03-29 MED ORDER — MAGNESIUM SULFATE 2 GM/50ML IV SOLN
2.0000 g | Freq: Once | INTRAVENOUS | Status: AC
  Administered 2020-03-29: 2 g via INTRAVENOUS
  Filled 2020-03-29: qty 50

## 2020-03-29 MED ORDER — ASPIRIN EC 81 MG PO TBEC
81.0000 mg | DELAYED_RELEASE_TABLET | Freq: Every day | ORAL | Status: DC
  Administered 2020-03-29 – 2020-04-01 (×4): 81 mg via ORAL
  Filled 2020-03-29 (×4): qty 1

## 2020-03-29 NOTE — TOC Initial Note (Signed)
Transition of Care [facility]) - Initial/Assessment Note    Patient Details  Name: [name] MRN: [id number] Date of Birth: June 17, 1955  Transition of Care [facility]) CM/SW Contact:    Ella Bodo, RN Phone Number: 03/29/2020, 4:03 PM  Clinical Narrative:   The pt is a 64 yo male presenting with headache, L-sided weakness and dysarthria. Imaging revealed acute right corona radiata infarct in addition to chronic R pontine infarct.  Prior to admission her, patient independent, lives at home alone.  PT recommending CIR, and consult has been ordered.  Spoke with patient's son, [name], at his request to discuss discharge planning.  Per son, patient has been living at home alone with no AC or heat; he also states that the home is not accessible to wheelchair or walker.  [name] states that neither he nor his husband can provide 24-hour care at discharge for patient.  Patient will likely need skilled nursing facility for rehab due to lack of support.  Will notify admissions coordinator with inpatient rehab.  Son also interested in completing advanced directives, which have been filled out and left at bedside.  Will consult chaplain to notarize advanced directives for patient.                Expected Discharge Plan: Skilled Nursing Facility Barriers to Discharge: Continued Medical Work up           Expected Discharge Plan and Services Expected Discharge Plan: [place]   Discharge Planning Services: CM Consult   Living arrangements for the past 2 months: Single Family Home                                      Prior Living Arrangements/Services Living arrangements for the past 2 months: Single Family Home Lives with:: Self Patient language and need for interpreter reviewed:: Yes Do you feel safe going back to the place where you live?: Yes      Need for Family Participation in Patient Care: Yes (Comment) Care giver support system in place?: No (comment)   Criminal  Activity/Legal Involvement Pertinent to Current Situation/Hospitalization: No - Comment as needed               Emotional Assessment Appearance:: Appears stated age Attitude/Demeanor/Rapport: Engaged Affect (typically observed): Accepting Orientation: : Oriented to Self, Oriented to Place, Oriented to  Time, Oriented to Situation      Admission diagnosis:  Acute arterial ischemic stroke, multifocal, anterior circulation, right [facility]) [I63.521] Patient Active Problem List   Diagnosis Date Noted  . Acute arterial ischemic stroke, multifocal, anterior circulation, right ([place]) 03/28/2020  . Abdominal pain, periumbilic 09/73/5329  . Dyspepsia and other specified disorders of function of stomach 10/03/2011  . DVT (deep venous thrombosis) ([place]) 04/10/2011  . 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  . Diarrhea 11/29/2010  . Esophageal reflux 11/29/2010  . Personal history of colonic polyps 11/29/2010   PCP:  Elizabeth Palau, MD (Inactive) Pharmacy:   [id number] [address] [place] [address] Phone: [phone] Fax: [phone]     Social Determinants of Health (SDOH) Interventions    Readmission Risk Interventions No flowsheet data found.  Reinaldo Raddle, RN, BSN  Trauma/Neuro ICU Case Manager [phone]

## 2020-03-29 NOTE — Progress Notes (Signed)
Rehab Admissions Coordinator Note:  Patient was screened by Cleatrice Burke for appropriateness for an Inpatient Acute Rehab Consult per therapy recommendations. .  At this time, we are recommending Inpatient Rehab consult. I will place order per protocol.  Cleatrice Burke RN MSN 03/29/2020, 8:57 AM  I can be reached at [phone].
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
## 2020-03-29 NOTE — Progress Notes (Signed)
STROKE TEAM PROGRESS NOTE   INTERVAL HISTORY No family present. Pt in bed, stable.  Patient states his left leg weakness has improved but left hand and upper extremity weakness persists though it is getting better.  Blood pressure adequately controlled.  Echocardiogram was unremarkable.  CT scan of the head done last night shows stable appearance of the right basal ganglia hemorrhage without any hemorrhagic transformation.  Vitals:   03/29/20 0600 03/29/20 0700 03/29/20 0800 03/29/20 0900  BP: 121/87  (!) 133/94 109/69  Pulse: 63 81 70 71  Resp: 16 20 20 17   Temp:   98.6 F (37 C)   TempSrc:   Oral   SpO2: 94% 96% 97% 96%  Weight:       CBC:  Recent Labs  Lab 03/28/20 0409 03/28/20 0409 03/28/20 0411 03/28/20 1114  WBC 8.4  --   --  11.6*  NEUTROABS 5.1  --   --   --   HGB 16.7   < > 16.3 16.3  HCT 48.8   < > 48.0 47.8  MCV 94.4  --   --  94.1  PLT 371  --   --  399   < > = values in this interval not displayed.   Basic Metabolic Panel:  Recent Labs  Lab 03/28/20 0409 03/28/20 0411 03/28/20 1114  NA 140 143  --   K 3.9 3.9  --   CL 106 105  --   CO2 24  --   --   GLUCOSE 123* 119*  --   BUN 6* 5*  --   CREATININE 0.94 0.90  --   CALCIUM 9.3  --   --   MG  --   --  1.7  PHOS  --   --  2.9   Lipid Panel:  Recent Labs  Lab 03/28/20 1114  CHOL 231*  TRIG 74  HDL 51  CHOLHDL 4.5  VLDL 15  LDLCALC 165*   HgbA1c:  Recent Labs  Lab 03/28/20 1114  HGBA1C 5.4   Urine Drug Screen:  Recent Labs  Lab 03/28/20 1318  LABOPIA NONE DETECTED  COCAINSCRNUR NONE DETECTED  LABBENZ NONE DETECTED  AMPHETMU NONE DETECTED  THCU NONE DETECTED  LABBARB NONE DETECTED    Alcohol Level  Recent Labs  Lab 03/28/20 0409  ETH <10    IMAGING past 24 hours CT HEAD WO CONTRAST  Result Date: 03/29/2020 CLINICAL DATA:  Stroke follow-up, 24 hour status post tPA administration. EXAM: CT HEAD WITHOUT CONTRAST TECHNIQUE: Contiguous axial images were obtained from the base  of the skull through the vertex without intravenous contrast. COMPARISON:  Head CT 03/28/2020 FINDINGS: Brain: Expected evolution of subacute right basal ganglia small vessel infarct. No intracranial hemorrhage. No midline shift or mass effect. Vascular: No abnormal hyperdensity of the major intracranial arteries or dural venous sinuses. No intracranial atherosclerosis. Skull: The visualized skull base, calvarium and extracranial soft tissues are normal. Sinuses/Orbits: No fluid levels or advanced mucosal thickening of the visualized paranasal sinuses. No mastoid or middle ear effusion. The orbits are normal. IMPRESSION: Expected evolution of subacute right basal ganglia small vessel infarct without acute intracranial hemorrhage. Electronically Signed   By: Ulyses Jarred M.D.   On: 03/29/2020 03:58   ECHOCARDIOGRAM COMPLETE  Result Date: 03/28/2020    ECHOCARDIOGRAM REPORT   Patient Name:   NATURE VOGELSANG Date of Exam: 03/28/2020 Medical Rec #:  [id number]       Height:  71.0 in Accession #:    [id number]      Weight:       162.0 lb Date of Birth:  Sep 13, 1955       BSA:          1.928 m Patient Age:    64 years        BP:           129/85 mmHg Patient Gender: M               HR:           74 bpm. Exam Location:  Inpatient Procedure: 2D Echo Indications:    stroke 434.91  History:        Patient has no prior history of Echocardiogram examinations.                 COPD; Risk Factors:Current Smoker, Hypertension and                 Dyslipidemia. ETOH ABUSE.  Sonographer:    Jannett Celestine RDCS (AE) Referring Phys: [id number] [place]  1. Left ventricular ejection fraction, by estimation, is 60 to 65%. The left ventricle has normal function. The left ventricle has no regional wall motion abnormalities. Left ventricular diastolic parameters are consistent with Grade I diastolic dysfunction (impaired relaxation).  2. Right ventricular systolic function is normal. The right ventricular size is  normal.  3. The mitral valve is grossly normal. No evidence of mitral valve regurgitation.  4. The aortic valve was not well visualized. Aortic valve regurgitation is not visualized. No aortic stenosis is present.  5. The inferior vena cava is normal in size with greater than 50% respiratory variability, suggesting right atrial pressure of 3 mmHg. Comparison(s): No prior Echocardiogram. Conclusion(s)/Recommendation(s): Normal biventricular function without evidence of hemodynamically significant valvular heart disease. FINDINGS  Left Ventricle: Left ventricular ejection fraction, by estimation, is 60 to 65%. The left ventricle has normal function. The left ventricle has no regional wall motion abnormalities. The left ventricular internal cavity size was normal in size. There is  no left ventricular hypertrophy. Left ventricular diastolic parameters are consistent with Grade I diastolic dysfunction (impaired relaxation). Right Ventricle: The right ventricular size is normal. No increase in right ventricular wall thickness. Right ventricular systolic function is normal. Left Atrium: Left atrial size was normal in size. Right Atrium: Right atrial size was normal in size. Pericardium: There is no evidence of pericardial effusion. Mitral Valve: The mitral valve is grossly normal. There is mild thickening of the mitral valve leaflet(s). No evidence of mitral valve regurgitation. Tricuspid Valve: The tricuspid valve is grossly normal. Tricuspid valve regurgitation is not demonstrated. Aortic Valve: The aortic valve was not well visualized. Aortic valve regurgitation is not visualized. No aortic stenosis is present. Pulmonic Valve: The pulmonic valve was not well visualized. Pulmonic valve regurgitation is not visualized. Aorta: The aortic root is normal in size and structure and the ascending aorta was not well visualized. Venous: The pulmonary veins were not well visualized. The inferior vena cava is normal in size with  greater than 50% respiratory variability, suggesting right atrial pressure of 3 mmHg. IAS/Shunts: The atrial septum is grossly normal.  LEFT VENTRICLE PLAX 2D LVIDd:         4.70 cm  Diastology LVIDs:         2.70 cm  LV e' medial:    5.98 cm/s LV PW:         1.00 cm  LV E/e' medial:  10.4 LV IVS:        0.70 cm  LV e' lateral:   6.53 cm/s LVOT diam:     2.00 cm  LV E/e' lateral: 9.5 LV SV:         67 LV SV Index:   35 LVOT Area:     3.14 cm  RIGHT VENTRICLE RV S prime:     14.10 cm/s TAPSE (M-mode): 1.4 cm LEFT ATRIUM           Index LA diam:      2.30 cm 1.19 cm/m LA Vol (A2C): 18.4 ml 9.54 ml/m  AORTIC VALVE LVOT Vmax:   96.60 cm/s LVOT Vmean:  63.000 cm/s LVOT VTI:    0.214 m  AORTA Ao Root diam: 3.10 cm MITRAL VALVE MV Area (PHT): 2.83 cm    SHUNTS MV Decel Time: 268 msec    Systemic VTI:  0.21 m MV E velocity: 62.10 cm/s  Systemic Diam: 2.00 cm MV A velocity: 74.60 cm/s MV E/A ratio:  0.83 Rudean Haskell MD Electronically signed by Rudean Haskell MD Signature Date/Time: 03/28/2020/4:33:28 PM    Final     PHYSICAL EXAM : Pleasant middle-age Caucasian male not in distress. . Afebrile. Head is nontraumatic. Neck is supple without bruit.    Cardiac exam no murmur or gallop. Lungs are clear to auscultation. Distal pulses are well felt. Neurological Exam : Awake alert oriented x 3 mildly dysarthric speech . Mild left lower face asymmetry. Tongue midline.  Left upper extremity drift.  With marked weakness of left grip and intrinsic hand muscles.. Orbits right over left upper extremity. Mild left grip weak.  Lower extremity strength is symmetric.Marland Kitchen Normal sensation . Normal coordination.  ASSESSMENT/PLAN Mr. Mac Dowdell Jahn is a 64 y.o. male with history of for HTN, HLD, ongoing tobacco abuse (1 ppd x 35 years), COPD (not on home O2), active alcoholism (reports 6 beers/day, last drink at 10 PM on 10/24), DVT (blood thinners discontinued 2-3 years ago per patient), colon polyps s/p partial  colectomy presenting with onset left sided weakness / numbness, diplopia on left gaze, hearing loss on the left. Received tPA 03/28/2020 at 0425.   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  Code Stroke CT head No acute abnormality. Chronic R pontine lacune. ASPECTS 10.     MRI  R basal ganglia and corona radiata infarct. Old R pontine lacune.   MRA head Unremarkable   MRA neck Unremarkable vasculature. L parotid mass   Repeat CT head w/ neuro worsening No change     2D Echo EF 60-65%. No source of embolus    LDL 165   HgbA1c 5.4   VTE prophylaxis - added Lovenox 40 mg sq daily  No antithrombotic prior to admission, now on aspirin 81 mg daily. Plan DAPT x 3 weeks then aspirin alone. patient advised to stop use of home motrin d/t bleeding risk added plavix   Therapy recommendations:  CIR  Disposition:  pending   Transfer to the floor  Blood Pressure  Home meds:  None, no hx htn . Long-BP stable . BP goal normotensive  Hyperlipidemia  Home meds:  No statin  LDL 165, goal < 70  Add statin   Continue statin at discharge  Other Stroke Risk Factors  Cigarette smoker, advised to stop smoking  ETOH abuse, alcohol level <10, advised to drink no more than 2 drink(s) a  day. On CIWA protocol. Strong risk for going into alcohol withdrawal and delirium tremens given history of heavy drinking.  Hx DVT off anticoagulation 2-3 yrs ago  Other Active Problems  COPD on inhaler  Parotid mass, 31mm which enhances during imaging, needs ENT follow up. Dr. Leonie Man discussed with son. Will likely need bx . If surgery needed, unable to have until recovered from stroke.  Hx colon polyps s/p partial colectomy  Hypomagnesemia Mg 1.7 - supplement  Hospital day # 1 Continue mobilization out of bed and ongoing therapy consults.  Transfer to neurology floor bed.  Hopefully transfer to inpatient rehab next few days when bed  available.  Discussed with patient and son over the phone and answered questions.  Greater than 50% time during this 35-minute visit was spent in counseling and coordination of care about his stroke and discussion about treatment plan and answered questions. Antony Contras, MD   To contact Stroke Continuity provider, please refer to [email or web address] After hours, contact General Neurology

## 2020-03-30 NOTE — NC FL2 (Addendum)
[place] MEDICAID FL2 LEVEL OF CARE SCREENING TOOL     IDENTIFICATION  Patient Name: Beniah Magnan Kocourek Birthdate: [birth year] Sex: male Admission Date (Current Location): 03/28/2020  [facility] and [place] Number:  Herbalist and Address:  [facility], [place] [address] [place], [place] [address]      Provider Number: [id number]  Attending Physician Name and Address:  Garvin Fila, MD  Relative Name and Phone Number:       Current Level of Care: Hospital Recommended Level of Care: [place] Prior Approval Number:    Date Approved/Denied:   PASRR Number: [id number]  Discharge Plan: SNF    Current Diagnoses: Patient Active Problem List   Diagnosis Date Noted   Acute arterial ischemic stroke, multifocal, anterior circulation, right ([place]) 03/28/2020   Abdominal pain, periumbilic 29/52/8413   Dyspepsia and other specified disorders of function of stomach 10/03/2011   DVT (deep venous thrombosis) ([place]) 04/10/2011   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   Diarrhea 11/29/2010   Esophageal reflux 11/29/2010   Personal history of colonic polyps 11/29/2010    Orientation RESPIRATION BLADDER Height & Weight     Self, Time, Situation, Place  O2 ([place] 2L) Continent Weight: 158 lb 8.2 oz (71.9 kg) Height:  5\' 11"  (180.3 cm)  BEHAVIORAL SYMPTOMS/MOOD NEUROLOGICAL BOWEL NUTRITION STATUS      Continent Diet (cardiac)  AMBULATORY STATUS COMMUNICATION OF NEEDS Skin   Limited Assist Verbally Normal                       Personal Care Assistance Level of Assistance  Bathing, Feeding, Dressing Bathing Assistance: Limited assistance Feeding assistance: Independent Dressing Assistance: Limited assistance     Functional Limitations Info  Hearing   Hearing Info: Impaired (hard of hearing)      [place]  PT (By licensed PT), OT (By licensed OT)     PT Frequency: 5x/wk OT Frequency: 5x/wk             Contractures Contractures Info: Not present    Additional Factors Info  Code Status, Allergies Code Status Info: Full Allergies Info: Iohexol           Current Medications (03/30/2020):  This is the current hospital active medication list Current Facility-Administered Medications  Medication Dose Route Frequency Provider Last Rate Last Admin   0.9 %  sodium chloride infusion   Intravenous Continuous Donzetta Starch, NP   Paused at 03/29/20 0142   acetaminophen (TYLENOL) tablet 650 mg  650 mg Oral Q4H PRN Donzetta Starch, NP       Or   acetaminophen (TYLENOL) 160 MG/5ML solution 650 mg  650 mg Per Tube Q4H PRN Donzetta Starch, NP       Or   acetaminophen (TYLENOL) suppository 650 mg  650 mg Rectal Q4H PRN Donzetta Starch, NP       aspirin EC tablet 81 mg  81 mg Oral Daily Burnetta Sabin L, NP   81 mg at 03/29/20 1055   atorvastatin (LIPITOR) tablet 80 mg  80 mg Oral Daily Burnetta Sabin L, NP   80 mg at 03/29/20 1626   Chlorhexidine Gluconate Cloth 2 % PADS 6 each  6 each Topical Daily Donzetta Starch, NP   6 each at 03/29/20 1400   clopidogrel (PLAVIX) tablet 75 mg  75 mg Oral Daily Burnetta Sabin L, NP   75 mg at 03/29/20 1055  enoxaparin (LOVENOX) injection 40 mg  40 mg Subcutaneous Q24H Donzetta Starch, NP   40 mg at 38/25/05 3976   folic acid (FOLVITE) tablet 1 mg  1 mg Oral Daily Donzetta Starch, NP   1 mg at 03/29/20 1054   influenza vac split quadrivalent PF (FLUARIX) injection 0.5 mL  0.5 mL Intramuscular Tomorrow-1000 Garvin Fila, MD       multivitamin with minerals tablet 1 tablet  1 tablet Oral Daily Donzetta Starch, NP   1 tablet at 03/29/20 1054   pneumococcal 23 valent vaccine (PNEUMOVAX-23) injection 0.5 mL  0.5 mL Intramuscular Tomorrow-1000 Garvin Fila, MD       senna-docusate (Senokot-S) tablet 1 tablet  1 tablet Oral QHS PRN Donzetta Starch, NP       thiamine tablet 100 mg  100 mg Oral Daily Burnetta Sabin L, NP   100 mg at 03/29/20 1055   Or   thiamine (B-1)  injection 100 mg  100 mg Intravenous Daily Donzetta Starch, NP       umeclidinium bromide (INCRUSE ELLIPTA) 62.5 MCG/INH 1 puff  1 puff Inhalation BID Donzetta Starch, NP   1 puff at 03/29/20 2106     Discharge Medications: Please see discharge summary for a list of discharge medications.  Relevant Imaging Results:  Relevant Lab Results:   Additional Information SS#: [id number]  Geralynn Ochs, LCSW   I have personally obtained history,examined this patient, reviewed notes, independently viewed imaging studies, participated in medical decision making and plan of care.ROS completed by me personally and pertinent positives fully documented  I have made any additions or clarifications directly to the above note. Agree with note above.  Antony Contras, MD Medical Director [place] Pager: [phone] 03/30/2020 2:02 PM

## 2020-03-30 NOTE — Progress Notes (Signed)
Physical Therapy Treatment Patient Details Name: [name] MRN: [id number] DOB: 08/03/1955 Today's Date: 03/30/2020    History of Present Illness The pt is a 64 yo male presenting with headache, L-sided weakness and dysarthria. Imaging revealed acute right corona radiata infarct in addition to chronic R pontine infarct. PMH includes: HTN, HLD, tobacco use, COPD, alcoholism, DVT, andcolon polyps s/p colectomy.     PT Comments    Pt tolerates treatment well, increasing ambulation distances although continuing to require significant physical assistance to maintain balance and prevent falls. Pt remains weak in LLE and is unable to grip RW with LUE at this time. Pt performs multiple transfers from recliner with improved technique. Pt will continue to benefit from PT POC to improve mobility quality and to restore independence. PT updates recommendations to SNF placement at this time as the pt has reduced caregiver support.  Follow Up Recommendations  SNF     Equipment Recommendations   (wheelchair, quad cane, all if home today)    Recommendations for Other Services       Precautions / Restrictions Precautions Precautions: Fall Precaution Comments: colostomy bag, L sided weakness Restrictions Weight Bearing Restrictions: No    Mobility  Bed Mobility Overal bed mobility: Needs Assistance Bed Mobility: Supine to Sit     Supine to sit: Supervision;HOB elevated     General bed mobility comments: use of bed rails  Transfers Overall transfer level: Needs assistance Equipment used: Rolling walker (2 wheeled) Transfers: Sit to/from Stand Sit to Stand: Min assist         General transfer comment: PT provides verbal cues for hand placement and transfer safety. Pt requires assistance to place LUE onto RW  Ambulation/Gait Ambulation/Gait assistance: Mod assist Gait Distance (Feet): 25 Feet Assistive device: Rolling walker (2 wheeled) Gait Pattern/deviations: Step-to  pattern;Drifts right/left;Decreased dorsiflexion - left Gait velocity: reduced Gait velocity interpretation: <1.8 ft/sec, indicate of risk for recurrent falls General Gait Details: pt with increased trunk sway and some L foot drag noted with fatigue. Poor awareness of positioning of L foot during stance phase. Pt requires constant assistance to maintain L hand on RW. Pt with increased balance deviations with turns   Stairs             Wheelchair Mobility    Modified Rankin (Stroke Patients Only) Modified Rankin (Stroke Patients Only) Pre-Morbid Rankin Score: No symptoms Modified Rankin: Moderately severe disability     Balance Overall balance assessment: Needs assistance Sitting-balance support: No upper extremity supported;Feet supported Sitting balance-Leahy Scale: Good     Standing balance support: Single extremity supported Standing balance-Leahy Scale: Poor Standing balance comment: minA with RUE support of RW                            Cognition Arousal/Alertness: Awake/alert Behavior During Therapy: WFL for tasks assessed/performed Overall Cognitive Status: Impaired/Different from baseline Area of Impairment: Awareness;Safety/judgement                         Safety/Judgement: Decreased awareness of safety Awareness: Emergent          Exercises      General Comments General comments (skin integrity, edema, etc.): VSS on RA      Pertinent Vitals/Pain Pain Assessment: No/denies pain    Home Living                      Prior  Function            PT Goals (current goals can now be found in the care plan section) Acute Rehab PT Goals Patient Stated Goal: return to living independently Progress towards PT goals: Progressing toward goals    Frequency    Min 4X/week      PT Plan Current plan remains appropriate    Co-evaluation              AM-PAC PT "6 Clicks" Mobility   Outcome Measure  Help needed  turning from your back to your side while in a flat bed without using bedrails?: None Help needed moving from lying on your back to sitting on the side of a flat bed without using bedrails?: None Help needed moving to and from a bed to a chair (including a wheelchair)?: A Lot Help needed standing up from a chair using your arms (e.g., wheelchair or bedside chair)?: A Little Help needed to walk in hospital room?: A Lot Help needed climbing 3-5 steps with a railing? : Total 6 Click Score: 16    End of Session Equipment Utilized During Treatment: Gait belt Activity Tolerance: Patient tolerated treatment well Patient left: in chair;with call bell/phone within reach;with chair alarm set Nurse Communication: Mobility status PT Visit Diagnosis: Unsteadiness on feet (R26.81);Other abnormalities of gait and mobility (R26.89);Difficulty in walking, not elsewhere classified (R26.2);Hemiplegia and hemiparesis Hemiplegia - Right/Left: Left Hemiplegia - dominant/non-dominant: Dominant Hemiplegia - caused by: Cerebral infarction     Time: 1440-1506 PT Time Calculation (min) (ACUTE ONLY): 26 min  Charges:  $Gait Training: 8-22 mins $Therapeutic Activity: 8-22 mins                     Zenaida Niece, PT, DPT Acute Rehabilitation Pager: [phone]    Zenaida Niece 03/30/2020, 3:46 PM

## 2020-03-30 NOTE — Progress Notes (Signed)
   03/30/20 0940  Clinical Encounter Type  Visited With Patient  Visit Type Follow-up  Referral From Nurse  Consult/Referral To Chaplain   Chaplain responded to AD consult. Chaplain followed up on completed AD paperwork. Chaplain is setting up notarization for 11 am today.   This note was prepared by Chaplain Resident, Dante Gang, MDiv. For questions, please contact by phone at [phone].

## 2020-03-30 NOTE — Evaluation (Signed)
Speech Language Pathology Evaluation Patient Details Name: EFREM PITSTICK MRN: [id number] DOB: September 28, 1955 Today's Date: 03/30/2020 Time: 7353-2992 SLP Time Calculation (min) (ACUTE ONLY): 23 min  Problem List:  Patient Active Problem List   Diagnosis Date Noted  . Acute arterial ischemic stroke, multifocal, anterior circulation, right ([place]) 03/28/2020  . Abdominal pain, periumbilic 42/68/3419  . Dyspepsia and other specified disorders of function of stomach 10/03/2011  . DVT (deep venous thrombosis) ([place]) 04/10/2011  . 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  . Diarrhea 11/29/2010  . Esophageal reflux 11/29/2010  . Personal history of colonic polyps 11/29/2010   Past Medical History:  Past Medical History:  Diagnosis Date  . Abdominal pain   . Alcoholism ([place])   . Arthritis   . COPD (chronic obstructive pulmonary disease) ([place])   . Depression   . Diarrhea   . Emphysema   . Fibromyalgia   . Fibromyalgia 1996  . GERD (gastroesophageal reflux disease)   . Gum disease   . Hearing loss   . Hx of blood clots   . Hx of colonic polyps 11/30/2010   tubular adenomas; had prior colectomy  . Hyperlipidemia   . Hypertension   . IBS (irritable bowel syndrome)   . Nausea   . Night sweats   . Vomiting    Past Surgical History:  Past Surgical History:  Procedure Laterality Date  . APPENDECTOMY    . COLECTOMY     inguinal rectal colectomy  . COLONOSCOPY    . FOOT SURGERY     left  . HAND SURGERY     for left finger amputations  . HEMICOLECTOMY  01/24/11  . NOSE SURGERY    . POLYPECTOMY    . PROCTOSCOPY  01/24/11   HPI:  The pt is a 64 yo male presenting with headache, L-sided weakness and dysarthria. Imaging revealed acute right corona radiata infarct in addition to chronic R pontine infarct. PMH includes: HTN, HLD, tobacco use, COPD, alcoholism, DVT, andcolon polyps s/p colectomy.    Assessment / Plan / Recommendation Clinical Impression  Pt presents with a moderate dysarthria  of speech marked by consonant imprecision, consistent errors, and hypernasal resonance.  He scored a 28/30 per the SLUMS, considered WFL for cognition.  Pt's fluency, expressive, and receptive language are WNL.  He is very motivated to begin rehab.  Recommend SLP f/u for dysarthria treatment - pt agrees with plan.     SLP Assessment  SLP Recommendation/Assessment: Patient needs continued Speech Lanaguage Pathology Services SLP Visit Diagnosis: Dysarthria and anarthria (R47.1)    Follow Up Recommendations  Inpatient Rehab    Frequency and Duration min 2x/week  1 week      SLP Evaluation Cognition  Overall Cognitive Status: Within Functional Limits for tasks assessed Arousal/Alertness: Awake/alert Orientation Level: Oriented X4 Attention: Selective Selective Attention: Appears intact Memory: Appears intact Awareness: Appears intact Problem Solving: Appears intact Safety/Judgment: Appears intact       Comprehension  Auditory Comprehension Overall Auditory Comprehension: Appears within functional limits for tasks assessed Reading Comprehension Reading Status: Within funtional limits    Expression Expression Primary Mode of Expression: Verbal Verbal Expression Overall Verbal Expression: Appears within functional limits for tasks assessed Written Expression Dominant Hand: Left Written Expression: Not tested   Oral / Motor  Oral Motor/Sensory Function Overall Oral Motor/Sensory Function: Moderate impairment Facial ROM: Reduced left;Suspected CN VII (facial) dysfunction Facial Symmetry: Abnormal symmetry left;Suspected CN VII (facial) dysfunction Lingual Symmetry: Within Functional Limits Motor Speech Overall Motor Speech: Impaired Respiration:  Within functional limits Phonation: Normal Resonance: Hypernasality Articulation: Impaired Level of Impairment: Sentence Intelligibility: Intelligibility reduced Word: 75-100% accurate Phrase: 75-100% accurate Sentence: 75-100%  accurate Conversation: 75-100% accurate Motor Planning: Witnin functional limits   GO                    Juan Quam Laurice 03/30/2020, 10:50 AM  Estill Bamberg L. Tivis Ringer, [place] Office number [phone] Pager [phone]

## 2020-03-30 NOTE — Progress Notes (Signed)
STROKE TEAM PROGRESS NOTE   INTERVAL HISTORY   Pt in bed, stable.  Patient states his left leg weakness has improved but left hand and upper extremity weakness persists though it is getting better.  Blood pressure adequately controlled.  Await rehab transfer. No complaints. Vitals stable  Vitals:   03/30/20 1538 03/30/20 1944 03/30/20 2348 03/31/20 0337  BP: 132/79 128/77 119/86 120/76  Pulse: 61 66 (!) 59 (!) 59  Resp: 18 20 18 18   Temp: 98 F (36.7 C) 98.4 F (36.9 C) 98.5 F (36.9 C) 97.9 F (36.6 C)  TempSrc: Oral Oral Oral Oral  SpO2: 96% 92% 95% 93%  Weight:      Height:       CBC:  Recent Labs  Lab 03/28/20 0409 03/28/20 0409 03/28/20 0411 03/28/20 1114  WBC 8.4  --   --  11.6*  NEUTROABS 5.1  --   --   --   HGB 16.7   < > 16.3 16.3  HCT 48.8   < > 48.0 47.8  MCV 94.4  --   --  94.1  PLT 371  --   --  399   < > = values in this interval not displayed.   Basic Metabolic Panel:  Recent Labs  Lab 03/28/20 0409 03/28/20 0411 03/28/20 1114  NA 140 143  --   K 3.9 3.9  --   CL 106 105  --   CO2 24  --   --   GLUCOSE 123* 119*  --   BUN 6* 5*  --   CREATININE 0.94 0.90  --   CALCIUM 9.3  --   --   MG  --   --  1.7  PHOS  --   --  2.9   Lipid Panel:  Recent Labs  Lab 03/28/20 1114  CHOL 231*  TRIG 74  HDL 51  CHOLHDL 4.5  VLDL 15  LDLCALC 165*   HgbA1c:  Recent Labs  Lab 03/28/20 1114  HGBA1C 5.4   Urine Drug Screen:  Recent Labs  Lab 03/28/20 1318  LABOPIA NONE DETECTED  COCAINSCRNUR NONE DETECTED  LABBENZ NONE DETECTED  AMPHETMU NONE DETECTED  THCU NONE DETECTED  LABBARB NONE DETECTED    Alcohol Level  Recent Labs  Lab 03/28/20 0409  ETH <10    IMAGING past 24 hours No results found.  PHYSICAL EXAM :   Pleasant middle-age Caucasian male not in distress. . Afebrile. Head is nontraumatic. Neck is supple without bruit.    Cardiac exam no murmur or gallop. Lungs are clear to auscultation. Distal pulses are well  felt. Neurological Exam : Awake alert oriented x 3 mildly dysarthric speech . Mild left lower face asymmetry. Tongue midline.  Left upper extremity drift.  With marked weakness of left grip and intrinsic hand muscles.. Orbits right over left upper extremity. Mild left grip weak.  Lower extremity strength is symmetric.Marland Kitchen Normal sensation . Normal coordination.   ASSESSMENT/PLAN [name] is a 64 y.o. male with history of for HTN, HLD, ongoing tobacco abuse (1 ppd x 35 years), COPD (not on home O2), active alcoholism (reports 6 beers/day, last drink at 10 PM on 10/24), DVT (blood thinners discontinued 2-3 years ago per patient), colon polyps s/p partial colectomy presenting with onset left sided weakness / numbness, diplopia on left gaze, hearing loss on the left. Received tPA 03/28/2020 at 0425.   Stroke:   R basal ganglia / corona radiata infarct s/p tPA secondary  to small vessel disease likely.  Code Stroke CT head No acute abnormality. Chronic R pontine lacune. ASPECTS 10.     MRI  R basal ganglia and corona radiata infarct. Old R pontine lacune.   MRA head Unremarkable   MRA neck Unremarkable vasculature. L parotid mass   Repeat CT head w/ neuro worsening No change     2D Echo EF 60-65%. No source of embolus    LDL 165   HgbA1c 5.4   VTE prophylaxis - added Lovenox 40 mg sq daily  No antithrombotic prior to admission, now on aspirin 81 mg daily. Plan DAPT x 3 weeks then aspirin alone. patient advised to stop use of home motrin d/t bleeding risk added plavix   Therapy recommendations:  CIR->SNF d/t lack of support following rehab stay  Disposition:  pending   Blood Pressure  Home meds:  None, no hx htn . BP goal normotensive . BP stable  Hyperlipidemia  Home meds:  No statin  LDL 165, goal < 70  Now on lipitor 80  Continue statin at discharge  Other Stroke Risk Factors  Cigarette smoker, advised to stop smoking  ETOH abuse, alcohol level <10, advised to  drink no more than 2 drink(s) a day. On CIWA protocol. Strong risk for going into alcohol withdrawal and delirium tremens given history of heavy drinking.  Hx DVT off anticoagulation 2-3 yrs ago  Other Active Problems  COPD on inhaler  Parotid mass, 67mm which enhances during imaging, needs ENT follow up. Dr. Leonie Man discussed with son. Will likely need bx . If surgery needed, unable to have until recovered from stroke.  Hx colon polyps s/p partial colectomy  Hypomagnesemia Mg 1.7 - supplement  Hospital day # 3   Await rehab bed. Mobilize out of bed.Continue ongoing therapies  Antony Contras, MD  To contact Stroke Continuity provider, please refer to [email or web address] After hours, contact General Neurology

## 2020-03-30 NOTE — Progress Notes (Signed)
   03/30/20 1056  Clinical Encounter Type  Visited With Patient  Visit Type Follow-up  Referral From Nurse  Consult/Referral To Chaplain   Chaplain facilitated the notarization of Pt's AD. Yvone Neu was the notary and Richardson Landry and George Ina were the witnesses. A copy of the AD was placed in the Pt's physical file and the original plus three copies were placed on the Pt's bedside table.  This note was prepared by Chaplain Resident, Dante Gang, MDiv. For questions, please contact by phone at [phone].

## 2020-03-30 NOTE — Progress Notes (Signed)
Inpatient Rehab Admissions:  Inpatient Rehab Consult received.  Chart reviewed and discussed with rehab MD.  Plans for SNF due to lack of 24/7 support and pay will not approve CIR and subsequent SNF for rehab.  Pt would benefit from Ambulatory Referral to [facility] once rehab completed at SNF.  Will sign off at this time.   Signed: Shann Medal, PT, DPT Admissions Coordinator [phone] 03/30/20  12:29 PM

## 2020-03-30 NOTE — TOC Progression Note (Signed)
Transition of Care [facility]) - Progression Note    Patient Details  Name: [name] MRN: [id number] Date of Birth: Sep 05, 1955  Transition of Care [facility]) CM/SW [place], [place] Phone Number: 03/30/2020, 11:26 AM  Clinical Narrative:   CSW met with patient to discuss bed offers. Patient said he thought he'd like [facility] in [place] but to call and speak with his son. CSW spoke with Eddie Dibbles and confirmed plan to admit to [facility] for SNF, as  Paul's best friend is the administrator there. CSW sent request to [facility] to confirm bed availability and will initiate insurance auth through [facility] Medicare for discharge to SNF when medically stable.    Expected Discharge Plan: [place] Barriers to Discharge: Continued Medical Work up  Expected Discharge Plan and Services Expected Discharge Plan: [place]   Discharge Planning Services: CM Consult   Living arrangements for the past 2 months: Single Family Home                                       Social Determinants of Health (SDOH) Interventions    Readmission Risk Interventions No flowsheet data found.

## 2020-03-31 DIAGNOSIS — J449 Chronic obstructive pulmonary disease, unspecified: Secondary | ICD-10-CM | POA: Diagnosis present

## 2020-03-31 DIAGNOSIS — F17210 Nicotine dependence, cigarettes, uncomplicated: Secondary | ICD-10-CM | POA: Diagnosis present

## 2020-03-31 DIAGNOSIS — E785 Hyperlipidemia, unspecified: Secondary | ICD-10-CM | POA: Diagnosis present

## 2020-03-31 DIAGNOSIS — F1010 Alcohol abuse, uncomplicated: Secondary | ICD-10-CM

## 2020-03-31 DIAGNOSIS — K118 Other diseases of salivary glands: Secondary | ICD-10-CM | POA: Diagnosis present

## 2020-03-31 LAB — SARS CORONAVIRUS 2 BY RT PCR (HOSPITAL ORDER, PERFORMED IN ~~LOC~~ HOSPITAL LAB): SARS Coronavirus 2: NEGATIVE

## 2020-03-31 NOTE — TOC Progression Note (Addendum)
Transition of Care [facility]) - Progression Note    Patient Details  Name: ROB MCIVER MRN: [id number] Date of Birth: Sep 12, 1955  Transition of Care [facility]) CM/SW [place], [place] Phone Number: 03/31/2020, 11:52 AM  Clinical Narrative:    Update 10/28 4:53pm-Insurance authorization has been approved. [employer] [phone]. Next review date is 11/1. Olivia Mackie with [facility] confirmed they can accept patient for SNF placement tomorrow 10/29 if medically ready.  Patient has SNF bed at [facility]. Insurance authorization has been approved.  CSW will continue to follow.   CSW started insurance authorization for patient reference number is # [id number]. Requested start date is for today 10/28.   Patient has SNF bed at [facility]. Insurance authorization is pending.  CSW will continue to follow.   Expected Discharge Plan: [place] Barriers to Discharge: Continued Medical Work up  Expected Discharge Plan and Services Expected Discharge Plan: [place]   Discharge Planning Services: CM Consult   Living arrangements for the past 2 months: Single Family Home                                       Social Determinants of Health (SDOH) Interventions    Readmission Risk Interventions No flowsheet data found.

## 2020-03-31 NOTE — Plan of Care (Signed)
  Problem: Intracerebral Hemorrhage Tissue Perfusion: Goal: Complications of Intracerebral Hemorrhage will be minimized Outcome: Progressing   Problem: Ischemic Stroke/TIA Tissue Perfusion: Goal: Complications of ischemic stroke/TIA will be minimized Outcome: Progressing

## 2020-03-31 NOTE — Plan of Care (Signed)
  Problem: Coping: Goal: Will verbalize positive feelings about self Outcome: Progressing Goal: Will identify appropriate support needs Outcome: Progressing   Problem: Education: Goal: Knowledge of disease or condition will improve Outcome: Progressing Goal: Knowledge of secondary prevention will improve Outcome: Progressing Goal: Knowledge of patient specific risk factors addressed and post discharge goals established will improve Outcome: Progressing

## 2020-03-31 NOTE — Progress Notes (Signed)
STROKE TEAM PROGRESS NOTE   INTERVAL HISTORY Up in chair at bedside, eating lunch.  He states left lower extremity strength has improved but left arm is still quite plegic.  He has been turned down by inpatient rehab and now we are looking at rehab in a skilled nursing set up.  Vital signs are stable.  Neuro exam is unchanged.  Vitals:   03/30/20 2348 03/31/20 0337 03/31/20 0831 03/31/20 1100  BP: 119/86 120/76 111/83 (!) 125/91  Pulse: (!) 59 (!) 59 60 65  Resp: 18 18 18 18   Temp: 98.5 F (36.9 C) 97.9 F (36.6 C) 99.1 F (37.3 C) 98.9 F (37.2 C)  TempSrc: Oral Oral Oral Oral  SpO2: 95% 93% 96% 95%  Weight:      Height:       CBC:  Recent Labs  Lab 03/28/20 0409 03/28/20 0409 03/28/20 0411 03/28/20 1114  WBC 8.4  --   --  11.6*  NEUTROABS 5.1  --   --   --   HGB 16.7   < > 16.3 16.3  HCT 48.8   < > 48.0 47.8  MCV 94.4  --   --  94.1  PLT 371  --   --  399   < > = values in this interval not displayed.   Basic Metabolic Panel:  Recent Labs  Lab 03/28/20 0409 03/28/20 0411 03/28/20 1114  NA 140 143  --   K 3.9 3.9  --   CL 106 105  --   CO2 24  --   --   GLUCOSE 123* 119*  --   BUN 6* 5*  --   CREATININE 0.94 0.90  --   CALCIUM 9.3  --   --   MG  --   --  1.7  PHOS  --   --  2.9   Lipid Panel:  Recent Labs  Lab 03/28/20 1114  CHOL 231*  TRIG 74  HDL 51  CHOLHDL 4.5  VLDL 15  LDLCALC 165*   HgbA1c:  Recent Labs  Lab 03/28/20 1114  HGBA1C 5.4   Urine Drug Screen:  Recent Labs  Lab 03/28/20 1318  LABOPIA NONE DETECTED  COCAINSCRNUR NONE DETECTED  LABBENZ NONE DETECTED  AMPHETMU NONE DETECTED  THCU NONE DETECTED  LABBARB NONE DETECTED    Alcohol Level  Recent Labs  Lab 03/28/20 0409  ETH <10    IMAGING past 24 hours No results found.  PHYSICAL EXAM : Pleasant middle-age Caucasian male not in distress. . Afebrile. Head is nontraumatic. Neck is supple without bruit.    Cardiac exam no murmur or gallop. Lungs are clear to  auscultation. Distal pulses are well felt. Neurological Exam : Awake alert oriented x 3 mildly dysarthric speech . Mild left lower face asymmetry. Tongue midline.  Left upper extremity drift.  With marked weakness of left grip and intrinsic hand muscles.. Orbits right over left upper extremity. Mild left grip weak.  Lower extremity strength is symmetric.Marland Kitchen Normal sensation . Normal coordination.   ASSESSMENT/PLAN [name] is a 64 y.o. male with history of for HTN, HLD, ongoing tobacco abuse (1 ppd x 35 years), COPD (not on home O2), active alcoholism (reports 6 beers/day, last drink at 10 PM on 10/24), DVT (blood thinners discontinued 2-3 years ago per patient), colon polyps s/p partial colectomy presenting with onset left sided weakness / numbness, diplopia on left gaze, hearing loss on the left. Received tPA 03/28/2020 at 0425.  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  Code Stroke CT head No acute abnormality. Chronic R pontine lacune. ASPECTS 10.     MRI  R basal ganglia and corona radiata infarct. Old R pontine lacune.   MRA head Unremarkable   MRA neck Unremarkable vasculature. L parotid mass   Repeat CT head w/ neuro worsening No change     2D Echo EF 60-65%. No source of embolus    LDL 165   HgbA1c 5.4   VTE prophylaxis - added Lovenox 40 mg sq daily  No antithrombotic prior to admission, now on aspirin 81 mg daily. Plan DAPT x 3 weeks then aspirin alone. patient advised to stop use of home motrin d/t bleeding risk added plavix   Therapy recommendations:  CIR->SNF d/t lack of support following rehab stay  Disposition:  pending   Medically ready for d/c when bed available - insurance auth is pending  Blood Pressure  Home meds:  None, no hx htn . BP goal normotensive . BP stable  Hyperlipidemia  Home meds:  No statin  LDL 165, goal < 70  Now on lipitor 80  Continue statin at  discharge  Other Stroke Risk Factors  Cigarette smoker, advised to stop smoking  ETOH abuse, alcohol level <10, advised to drink no more than 2 drink(s) a day. On CIWA protocol. Strong risk for going into alcohol withdrawal and delirium tremens given history of heavy drinking.  Hx DVT off anticoagulation 2-3 yrs ago  Other Active Problems  COPD on inhaler  Parotid mass, 64mm which enhances during imaging, needs ENT follow up. Dr. Leonie Man discussed with son. Will likely need bx . If surgery needed, unable to have until recovered from stroke.  Hx colon polyps s/p partial colectomy  Hypomagnesemia Mg 1.7 - supplement  Hospital day # 3 Continue ongoing therapies.  Patient medically stable to be transferred to skilled nursing facility for rehab when bed becomes available. Antony Contras, MD   To contact Stroke Continuity provider, please refer to [email or web address] After hours, contact General Neurology

## 2020-03-31 NOTE — Discharge Summary (Addendum)
Stroke Discharge Summary  Patient ID: [name]   MRN: [id number]      DOB: 1956/05/01  Date of Admission: 03/28/2020 Date of Discharge: 04/01/2020  Attending Physician:  Garvin Fila, MD, Stroke MD Consultant(s):    None  Patient's PCP:  Elizabeth Palau, MD (Inactive)  DISCHARGE DIAGNOSIS:  Principal Problem:   Acute arterial ischemic stroke, multifocal, anterior circulation, right ([place]) s/p tPA due to small vessel disease Active Problems:   Personal history of colonic polyps   Hyperlipidemia   Cigarette smoker   ETOH abuse   COPD (chronic obstructive pulmonary disease) ([place])   Parotid mass   Allergies as of 04/01/2020      Reactions   Iohexol Hives   IVP CONTRAST      Medication List    STOP taking these medications   HYDROcodone-acetaminophen 5-325 MG tablet Commonly known as: NORCO/VICODIN   ibuprofen 200 MG tablet Commonly known as: ADVIL   ibuprofen 600 MG tablet Commonly known as: ADVIL   naproxen sodium 220 MG tablet Commonly known as: ALEVE     TAKE these medications   aspirin 81 MG EC tablet Take 1 tablet (81 mg total) by mouth daily. Swallow whole.   atorvastatin 80 MG tablet Commonly known as: LIPITOR Take 1 tablet (80 mg total) by mouth daily.   clopidogrel 75 MG tablet Commonly known as: PLAVIX Take 1 tablet (75 mg total) by mouth daily for 21 days.   multivitamin with minerals Tabs tablet Take 1 tablet by mouth daily. Start taking on: April 02, 2020   tiotropium 18 MCG inhalation capsule Commonly known as: SPIRIVA Place 18 mcg into inhaler and inhale 2 (two) times daily.       LABORATORY STUDIES CBC    Component Value Date/Time   WBC 11.6 (H) 03/28/2020 1114   RBC 5.08 03/28/2020 1114   HGB 16.3 03/28/2020 1114   HGB 17.1 08/29/2011 1400   HCT 47.8 03/28/2020 1114   HCT 47.1 08/29/2011 1400   PLT 399 03/28/2020 1114   PLT 349 08/29/2011 1400   MCV 94.1 03/28/2020 1114   MCV 87.2 08/29/2011 1400   MCH  32.1 03/28/2020 1114   MCHC 34.1 03/28/2020 1114   RDW 11.6 03/28/2020 1114   RDW 12.2 08/29/2011 1400   LYMPHSABS 1.9 03/28/2020 0409   LYMPHSABS 1.6 08/29/2011 1400   MONOABS 1.0 03/28/2020 0409   MONOABS 0.8 08/29/2011 1400   EOSABS 0.4 03/28/2020 0409   EOSABS 0.3 08/29/2011 1400   BASOSABS 0.1 03/28/2020 0409   BASOSABS 0.0 08/29/2011 1400   CMP    Component Value Date/Time   NA 143 03/28/2020 0411   NA 141 09/07/2011 1412   K 3.9 03/28/2020 0411   K 4.0 09/07/2011 1412   CL 105 03/28/2020 0411   CL 101 09/07/2011 1412   CO2 24 03/28/2020 0409   CO2 26 09/07/2011 1412   GLUCOSE 119 (H) 03/28/2020 0411   GLUCOSE 138 (H) 09/07/2011 1412   BUN 5 (L) 03/28/2020 0411   BUN 8 09/07/2011 1412   CREATININE 0.90 03/28/2020 0411   CREATININE 1.2 09/07/2011 1412   CALCIUM 9.3 03/28/2020 0409   CALCIUM 8.8 09/07/2011 1412   PROT 6.9 03/28/2020 0409   ALBUMIN 3.8 03/28/2020 0409   AST 38 03/28/2020 0409   ALT 50 (H) 03/28/2020 0409   ALKPHOS 52 03/28/2020 0409   BILITOT 0.8 03/28/2020 0409   GFRNONAA >60 03/28/2020 0409   GFRAA >90 01/13/2014  [place] Results  Component Value Date   INR 1.0 03/28/2020   Lipid Panel    Component Value Date/Time   CHOL 231 (H) 03/28/2020 1114   TRIG 74 03/28/2020 1114   HDL 51 03/28/2020 1114   CHOLHDL 4.5 03/28/2020 1114   VLDL 15 03/28/2020 1114   LDLCALC 165 (H) 03/28/2020 1114   HgbA1C  Lab Results  Component Value Date   HGBA1C 5.4 03/28/2020   Urine Drug Screen     Component Value Date/Time   LABOPIA NONE DETECTED 03/28/2020 1318   COCAINSCRNUR NONE DETECTED 03/28/2020 1318   LABBENZ NONE DETECTED 03/28/2020 1318   AMPHETMU NONE DETECTED 03/28/2020 1318   THCU NONE DETECTED 03/28/2020 1318   LABBARB NONE DETECTED 03/28/2020 1318    Alcohol Level    Component Value Date/Time   ETH <10 03/28/2020 0409     SIGNIFICANT DIAGNOSTIC STUDIES CT HEAD WO CONTRAST  Result Date: 03/29/2020 CLINICAL DATA:   Stroke follow-up, 24 hour status post tPA administration. EXAM: CT HEAD WITHOUT CONTRAST TECHNIQUE: Contiguous axial images were obtained from the base of the skull through the vertex without intravenous contrast. COMPARISON:  Head CT 03/28/2020 FINDINGS: Brain: Expected evolution of subacute right basal ganglia small vessel infarct. No intracranial hemorrhage. No midline shift or mass effect. Vascular: No abnormal hyperdensity of the major intracranial arteries or dural venous sinuses. No intracranial atherosclerosis. Skull: The visualized skull base, calvarium and extracranial soft tissues are normal. Sinuses/Orbits: No fluid levels or advanced mucosal thickening of the visualized paranasal sinuses. No mastoid or middle ear effusion. The orbits are normal. IMPRESSION: Expected evolution of subacute right basal ganglia small vessel infarct without acute intracranial hemorrhage. Electronically Signed   By: Ulyses Jarred M.D.   On: 03/29/2020 03:58   CT HEAD WO CONTRAST  Result Date: 03/28/2020 CLINICAL DATA:  Stroke, follow-up. EXAM: CT HEAD WITHOUT CONTRAST TECHNIQUE: Contiguous axial images were obtained from the base of the skull through the vertex without intravenous contrast. COMPARISON:  MRI/MRA head and MRA neck 03/28/2020. Noncontrast head CT 03/28/2020. FINDINGS: Brain: Mild generalized cerebral atrophy. A known acute perforator infarct within the right corona radiata/basal ganglia was better delineated on the brain MRI performed earlier the same day. Redemonstrated chronic lacunar infarct within the right pons. There is no acute intracranial hemorrhage. No extra-axial fluid collection. No evidence of intracranial mass. No midline shift. Vascular: No hyperdense vessel. Skull: Normal. Negative for fracture or focal lesion. Sinuses/Orbits: Visualized orbits show no acute finding. 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 Postsurgical appearance of the paranasal sinuses. No significant paranasal sinus disease or mastoid effusion at the imaged levels. IMPRESSION: Known acute perforator infarct affecting the right corona radiata/basal ganglia, better appreciated on the same-day brain MRI. Redemonstrated chronic lacunar infarct within the right pons. No evidence of acute intracranial hemorrhage. Electronically Signed   By: Kellie Simmering DO   On: 03/28/2020 07:22   MR ANGIO HEAD WO CONTRAST  Result Date: 03/28/2020 CLINICAL DATA:  Acute stroke suspected EXAM: MR HEAD WITHOUT CONTRAST MR CIRCLE OF WILLIS WITHOUT CONTRAST MRA OF THE NECK WITHOUT AND WITH CONTRAST TECHNIQUE: Multiplanar, multiecho pulse sequences of the brain, circle of willis and surrounding structures were obtained without intravenous contrast. Angiographic images of the neck were obtained using MRA technique without and with intravenous contrast. CONTRAST:  89mL GADAVIST GADOBUTROL 1 MMOL/ML IV SOLN COMPARISON:  Code stroke CT from earlier today FINDINGS: MR HEAD  FINDINGS Brain: Wedge of restricted diffusion traversing the posterior right putamen, corona radiata, and caudate body. The restriction is somewhat subtle and likely hyperacute. Remote lacunar infarct at the right pons. Mild cortical atrophy. No hemorrhage, hydrocephalus, or masslike finding. Vascular: Normal flow voids.  MRA below Skull and upper cervical spine: Normal marrow signal Sinuses/Orbits: Probable prior maxillary antrostomies. No sinusitis. MR CIRCLE OF WILLIS FINDINGS Symmetric carotid and vertebral artery size. No major vessel occlusion, stenosis, or beading. Negative for aneurysm or signs of vascular malformation. Smooth appearance of the right M1 segment. Mild atheromatous change to medium size branches. MRA NECK FINDINGS By time-of-flight there is antegrade flow in both carotid and vertebral arteries. 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 Normal  appearance of the arch with 3 vessel branching. The carotid arteries are widely patent. The carotids show a low branching pattern. The codominant vertebral arteries are smooth and widely patent when allowing for artifact at the V1 segments. IMPRESSION: Brain MRI: 1. Acute perforator infarct at the right basal ganglia and corona radiata. 2. Remote lacunar infarct at the right pons. Intracranial MRA: No emergent finding.  Mild atheromatous changes. Neck MRA: 1. Negative arterial structures. 2. 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 Electronically Signed   By: Monte Fantasia M.D.   On: 03/28/2020 06:13   MR ANGIO NECK W WO CONTRAST  Result Date: 03/28/2020 CLINICAL DATA:  Acute stroke suspected EXAM: MR HEAD WITHOUT CONTRAST MR CIRCLE OF WILLIS WITHOUT CONTRAST MRA OF THE NECK WITHOUT AND WITH CONTRAST TECHNIQUE: Multiplanar, multiecho pulse sequences of the brain, circle of willis and surrounding structures were obtained without intravenous contrast. Angiographic images of the neck were obtained using MRA technique without and with intravenous contrast. CONTRAST:  48mL GADAVIST GADOBUTROL 1 MMOL/ML IV SOLN COMPARISON:  Code stroke CT from earlier today FINDINGS: MR HEAD FINDINGS Brain: Wedge of restricted diffusion traversing the posterior right putamen, corona radiata, and caudate body. The restriction is somewhat subtle and likely hyperacute. Remote lacunar infarct at the right pons. Mild cortical atrophy. No hemorrhage, hydrocephalus, or masslike finding. Vascular: Normal flow voids.  MRA below Skull and upper cervical spine: Normal marrow signal Sinuses/Orbits: Probable prior maxillary antrostomies. No sinusitis. MR CIRCLE OF WILLIS FINDINGS Symmetric carotid and vertebral artery size. No major vessel occlusion, stenosis, or beading. Negative for aneurysm or signs of vascular malformation. Smooth appearance of the right M1 segment. Mild atheromatous change to medium size branches. MRA NECK FINDINGS  By time-of-flight there is antegrade flow in both carotid and vertebral arteries. 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 Normal appearance of the arch with 3 vessel branching. The carotid arteries are widely patent. The carotids show a low branching pattern. The codominant vertebral arteries are smooth and widely patent when allowing for artifact at the V1 segments. IMPRESSION: Brain MRI: 1. Acute perforator infarct at the right basal ganglia and corona radiata. 2. Remote lacunar infarct at the right pons. Intracranial MRA: No emergent finding.  Mild atheromatous changes. Neck MRA: 1. Negative arterial structures. 2. 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 Electronically Signed   By: Monte Fantasia M.D.   On: 03/28/2020 06:13   MR BRAIN WO CONTRAST  Result Date: 03/28/2020 CLINICAL DATA:  Acute stroke suspected EXAM: MR HEAD WITHOUT CONTRAST MR CIRCLE OF WILLIS WITHOUT CONTRAST MRA OF THE NECK WITHOUT AND WITH CONTRAST TECHNIQUE: Multiplanar, multiecho pulse sequences of the brain, circle of  willis and surrounding structures were obtained without intravenous contrast. Angiographic images of the neck were obtained using MRA technique without and with intravenous contrast. CONTRAST:  50mL GADAVIST GADOBUTROL 1 MMOL/ML IV SOLN COMPARISON:  Code stroke CT from earlier today FINDINGS: MR HEAD FINDINGS Brain: Wedge of restricted diffusion traversing the posterior right putamen, corona radiata, and caudate body. The restriction is somewhat subtle and likely hyperacute. Remote lacunar infarct at the right pons. Mild cortical atrophy. No hemorrhage, hydrocephalus, or masslike finding. Vascular: Normal flow voids.  MRA below Skull and upper cervical spine: Normal marrow signal Sinuses/Orbits: Probable prior maxillary antrostomies. No sinusitis. MR CIRCLE OF WILLIS FINDINGS Symmetric carotid and vertebral artery size. No major vessel occlusion, stenosis, or beading. Negative  for aneurysm or signs of vascular malformation. Smooth appearance of the right M1 segment. Mild atheromatous change to medium size branches. MRA NECK FINDINGS By time-of-flight there is antegrade flow in both carotid and vertebral arteries. 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 Normal appearance of the arch with 3 vessel branching. The carotid arteries are widely patent. The carotids show a low branching pattern. The codominant vertebral arteries are smooth and widely patent when allowing for artifact at the V1 segments. IMPRESSION: Brain MRI: 1. Acute perforator infarct at the right basal ganglia and corona radiata. 2. Remote lacunar infarct at the right pons. Intracranial MRA: No emergent finding.  Mild atheromatous changes. Neck MRA: 1. Negative arterial structures. 2. 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 Electronically Signed   By: Monte Fantasia M.D.   On: 03/28/2020 06:13   ECHOCARDIOGRAM COMPLETE  Result Date: 03/28/2020    ECHOCARDIOGRAM REPORT   Patient Name:   PETER KEYWORTH Houser Date of Exam: 03/28/2020 Medical Rec #:  [id number]       Height:       71.0 in Accession #:    [id number]      Weight:       162.0 lb Date of Birth:  09-Sep-1955       BSA:          1.928 m Patient Age:    54 years        BP:           129/85 mmHg Patient Gender: M               HR:           74 bpm. Exam Location:  Inpatient Procedure: 2D Echo Indications:    stroke 434.91  History:        Patient has no prior history of Echocardiogram examinations.                 COPD; Risk Factors:Current Smoker, Hypertension and                 Dyslipidemia. ETOH ABUSE.  Sonographer:    Jannett Celestine RDCS (AE) Referring Phys: [id number] [place]  1. Left ventricular ejection fraction, by estimation, is 60 to 65%. The left ventricle has normal function. The left ventricle has no regional wall motion abnormalities. Left ventricular diastolic parameters are consistent with Grade  I diastolic dysfunction (impaired relaxation).  2. Right ventricular systolic function is normal. The right ventricular size is normal.  3. The mitral valve is grossly normal. No evidence of mitral valve regurgitation.  4. The aortic valve was not well visualized. Aortic valve regurgitation is not visualized. No aortic stenosis is present.  5.  The inferior vena cava is normal in size with greater than 50% respiratory variability, suggesting right atrial pressure of 3 mmHg. Comparison(s): No prior Echocardiogram. Conclusion(s)/Recommendation(s): Normal biventricular function without evidence of hemodynamically significant valvular heart disease. FINDINGS  Left Ventricle: Left ventricular ejection fraction, by estimation, is 60 to 65%. The left ventricle has normal function. The left ventricle has no regional wall motion abnormalities. The left ventricular internal cavity size was normal in size. There is  no left ventricular hypertrophy. Left ventricular diastolic parameters are consistent with Grade I diastolic dysfunction (impaired relaxation). Right Ventricle: The right ventricular size is normal. No increase in right ventricular wall thickness. Right ventricular systolic function is normal. Left Atrium: Left atrial size was normal in size. Right Atrium: Right atrial size was normal in size. Pericardium: There is no evidence of pericardial effusion. Mitral Valve: The mitral valve is grossly normal. There is mild thickening of the mitral valve leaflet(s). No evidence of mitral valve regurgitation. Tricuspid Valve: The tricuspid valve is grossly normal. Tricuspid valve regurgitation is not demonstrated. Aortic Valve: The aortic valve was not well visualized. Aortic valve regurgitation is not visualized. No aortic stenosis is present. Pulmonic Valve: The pulmonic valve was not well visualized. Pulmonic valve regurgitation is not visualized. Aorta: The aortic root is normal in size and structure and the ascending  aorta was not well visualized. Venous: The pulmonary veins were not well visualized. The inferior vena cava is normal in size with greater than 50% respiratory variability, suggesting right atrial pressure of 3 mmHg. IAS/Shunts: The atrial septum is grossly normal.  LEFT VENTRICLE PLAX 2D LVIDd:         4.70 cm  Diastology LVIDs:         2.70 cm  LV e' medial:    5.98 cm/s LV PW:         1.00 cm  LV E/e' medial:  10.4 LV IVS:        0.70 cm  LV e' lateral:   6.53 cm/s LVOT diam:     2.00 cm  LV E/e' lateral: 9.5 LV SV:         67 LV SV Index:   35 LVOT Area:     3.14 cm  RIGHT VENTRICLE RV S prime:     14.10 cm/s TAPSE (M-mode): 1.4 cm LEFT ATRIUM           Index LA diam:      2.30 cm 1.19 cm/m LA Vol (A2C): 18.4 ml 9.54 ml/m  AORTIC VALVE LVOT Vmax:   96.60 cm/s LVOT Vmean:  63.000 cm/s LVOT VTI:    0.214 m  AORTA Ao Root diam: 3.10 cm MITRAL VALVE MV Area (PHT): 2.83 cm    SHUNTS MV Decel Time: 268 msec    Systemic VTI:  0.21 m MV E velocity: 62.10 cm/s  Systemic Diam: 2.00 cm MV A velocity: 74.60 cm/s MV E/A ratio:  0.83 Rudean Haskell MD Electronically signed by Rudean Haskell MD Signature Date/Time: 03/28/2020/4:33:28 PM    Final    CT HEAD CODE STROKE WO CONTRAST  Result Date: 03/28/2020 CLINICAL DATA:  Code stroke.  Left-sided facial droop and weakness. EXAM: CT HEAD WITHOUT CONTRAST TECHNIQUE: Contiguous axial images were obtained from the base of the skull through the vertex without intravenous contrast. COMPARISON:  None. FINDINGS: Brain: No evidence of acute infarction, hemorrhage, hydrocephalus, extra-axial collection or mass lesion/mass effect. Chronic appearing lacunar infarct in the right pons. Vascular: No hyperdense vessel when comparing 1 to  another. Mild atheromatous calcification. Skull: Normal. Negative for fracture or focal lesion. Sinuses/Orbits: No acute finding. 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 Other:  These results were called by telephone at the time of interpretation on 03/28/2020 at 4:23 am to provider Dr Curly [place], who verbally acknowledged these results. ASPECTS [facility] Stroke Program Early CT Score) - Ganglionic level infarction (caudate, lentiform nuclei, internal capsule, insula, M1-M3 cortex): 7 - Supraganglionic infarction (M4-M6 cortex): 3 Total score (0-10 with 10 being normal): 10 IMPRESSION: 1. No acute finding.ASPECTS is 10. 2. Chronic appearing lacunar infarct in the right pons. Electronically Signed   By: Monte Fantasia M.D.   On: 03/28/2020 04:27      HISTORY OF PRESENT ILLNESS Izeyah P Baik is a 64 y.o. male p/w acute onset left sided weakness / numbness, diplopia on left gaze, hearing loss on the left, with a PMHx significant for HTN, HLD, ongoing tobacco abuse (1 ppd x 35 years), COPD (not on home O2), active alcoholism (reports 6 beers/day, last drink at 10 PM on 10/24), DVT (blood thinners discontinued 2-3 years ago per patient), colon polyps s/p partial colectomy.  He reports that he was in his normal state of health yesterday, he drank the last of his typical 6 beers at 10 PM and went to bed at midnight (LKW 03/28/2020 0001).  When he woke to use the bathroom at 3 AM he found himself unable to get out of bed due to left-sided weakness.  Therefore he activated EMS.  EMS reported borderline oxygenation status at 92% on room air, for which they started him on supplemental oxygen with good response to 98%.  However he is not having any signs of respiratory distress.  His blood pressures were noted to be in the 140s, very mildly elevated blood glucose, and regular rhythm with heart rate in the 80s.  He did have severe and left-sided weakness on their evaluation for which code stroke was activated.  On review of systems he additionally complains of a headache, new lateral diplopia on lateral gaze, mild holocephalic headache, numbness of the left side, acute left-sided severe hearing  loss on chronic baseline hearing loss bilaterally; he reports no other similar symptoms in the past, he reports occasional palpitations but no chest pain, shortness of breath, chest pressure; endorses a chronic cough but no new symptoms or worsened sputum production, no fevers or chills; endorses chronic diarrhea, but no blood in his urine or his stool; reports no new swelling of his limbs; reports no change in his baseline smoking and drinking; reports his only medications are a COPD inhaler and an "over-the-counter prostate pill and over-the-counter colon pill"  After tPA administration, his headache, double vision on left lateral gaze, left-sided hearing loss, left-sided weakness, and left-sided numbness were all rapidly resolving. He was not an IA candidate as there was no large vessel occlusion.   Premorbid modified rankin scale:  0 - 1  Initial NIH stroke scale at the time of admission was 12:  Facial paresis-3  Left arm weakness-4  Left leg weakness-3  Sensory loss on the left -1  Dysarthria-1    HOSPITAL COURSE Mr. Addison Whidbee Merfeld is a 64 y.o. male with history of for HTN, HLD, ongoing tobacco abuse (1 ppdx 35 years), COPD (not on home O2), active alcoholism (reports 6 beers/day, last drink at 10 PM on 10/24), DVT (blood thinners discontinued 2-3 years ago per patient), colon polyps s/ppartialcolectomy presenting with onset left sided weakness /  numbness, diplopia on left gaze, hearing loss on the left. Received tPA 03/28/2020 at 0425.   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  Code Stroke CT head No acute abnormality. Chronic R pontine lacune. ASPECTS 10.     MRI  R basal ganglia and corona radiata infarct. Old R pontine lacune.   MRA head Unremarkable   MRA neck Unremarkable vasculature. L parotid mass   Repeat CT head w/ neuro worsening No change     2D Echo EF 60-65%. No source of embolus    LDL 165    HgbA1c 5.4   VTE prophylaxis - added Lovenox 40 mg sq daily  No antithrombotic prior to admission, now on aspirin 81 mg daily. Plan DAPT x 3 weeks then aspirin alone. patient advised to stop use of home motrin d/t bleeding risk added plavix   Therapy recommendations:  CIR->SNF d/t lack of support following rehab stay  Disposition:  SNF  Blood Pressure  Home meds:  None, no hx htn  BP goal normotensive  BP stable  Hyperlipidemia  Home meds:  No statin  LDL 165, goal < 70  Now on lipitor 80  Continue statin at discharge  Other Stroke Risk Factors  Cigarette smoker, advised to stop smoking  ETOH abuse, alcohol level <10, advised to drink no more than 2 drink(s) a day. On CIWA protocol. Strong risk for going into alcohol withdrawal and delirium tremens given history of heavy drinking.  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 Dr. Leonie Man discussed with son. Will likely need bx . If surgery needed, unable to have until recovered from stroke.  Hx colon polyps s/p partial colectomy  Hypomagnesemia Mg 1.7 - supplement   DISCHARGE EXAM Blood pressure 133/86, pulse 60, temperature 97.8 F (36.6 C), temperature source Oral, resp. rate 18, height 5\' 11"  (1.803 m), weight 71.9 kg, SpO2 96 %. Pleasant middle-age Caucasian male not in distress. . Afebrile. Head is nontraumatic. Neck is supple without bruit.    Cardiac exam no murmur or gallop. Lungs are clear to auscultation. Distal pulses are well felt. Neurological Exam : Awake alert oriented x 3 mildly dysarthric speech . Mild left lower face asymmetry. Tongue midline.  Left upper extremity drift.  With marked weakness of left grip and intrinsic hand muscles.. Orbits right over left upper extremity. Mild left grip weak.  Lower extremity strength is symmetric.Marland Kitchen Normal sensation . Normal coordination.  Discharge  Diet   Heart healthy diet, thin liquids  DISCHARGE PLAN  Disposition:  Skilled nursing facility for ongoing PT, OT and ST.   clopidogrel 75 mg daily for secondary stroke prevention for 3 weeks then ASPIRIN alone.  Ongoing stroke risk factor control by Primary Care Physician at time of discharge  Follow-up PCP Blount, Denton Meek, MD (Inactive) in 2 weeks.  Follow-up in [facility] in 4 weeks, office to schedule an appointment.   35 minutes were spent preparing discharge.  Burnetta Sabin, MSN, APRN, ANVP-BC, AGPCNP-BC Advanced Practice Stroke Nurse [place] for Schedule & Pager information 04/01/2020 12:07 PM   I have personally obtained history,examined this patient, reviewed notes, independently viewed imaging studies, participated in medical decision making and plan of care.ROS completed by me personally and pertinent positives fully documented  I have made any additions  or clarifications directly to the above note. Agree with note above.   Antony Contras, MD Medical Director [facility] Pager: [phone] 04/01/2020 12:55 PM

## 2020-03-31 NOTE — Progress Notes (Signed)
Physical Therapy Treatment Patient Details Name: [name] MRN: [id number] DOB: [birth year] Today's Date: 03/31/2020    History of Present Illness The pt is a 64 yo male presenting with headache, L-sided weakness and dysarthria. Imaging revealed acute right corona radiata infarct in addition to chronic R pontine infarct. PMH includes: HTN, HLD, tobacco use, COPD, alcoholism, DVT, andcolon polyps s/p colectomy.     PT Comments    Pt tolerates treatment well with improved gait tolerance this session. PT demonstrates improved ambulation quality with slowed gait speed, however with turns, increased gait speed, or fatigue, the pt demonstrate greater balance and gait impairments. PT providing exercise to emphasize WB and strengthening of LLE during transfers. Pt will benefit from continued acute PT POC to reduce falls risk and aide in a return to independence. PT continues to recommend SNF placement at this time.   Follow Up Recommendations  SNF     Equipment Recommendations   (wheelchair and hemi walker if home today)    Recommendations for Other Services       Precautions / Restrictions Precautions Precautions: Fall Precaution Comments: colostomy bag, L sided weakness Restrictions Weight Bearing Restrictions: No    Mobility  Bed Mobility Overal bed mobility: Needs Assistance Bed Mobility: Supine to Sit     Supine to sit: Supervision     General bed mobility comments: increased time, PT cues for LUE positioning  Transfers Overall transfer level: Needs assistance Equipment used: Rolling walker (2 wheeled) Transfers: Sit to/from [facility] Sit to Stand: Min guard Stand pivot transfers: Mod assist       General transfer comment: pt performs 6 sit to stands, 4 of which with RLE extended anteriorly to increase WB and use of LLE  Ambulation/Gait Ambulation/Gait assistance: Mod assist Gait Distance (Feet): 70 Feet Assistive device: Rolling walker (2  wheeled) Gait Pattern/deviations: Step-to pattern Gait velocity: reduced Gait velocity interpretation: <1.8 ft/sec, indicate of risk for recurrent falls General Gait Details: pt with reduced foot clearance of LLE with fatigue, remains unable to grip RW resulting in difficulty managing RW. With increased gait speed pt with multiple losses of balance needing PT physical assistance to maintain balance. With turns pt with increased sway and loss of balance   Stairs             Wheelchair Mobility    Modified Rankin (Stroke Patients Only) Modified Rankin (Stroke Patients Only) Pre-Morbid Rankin Score: No symptoms Modified Rankin: Moderately severe disability     Balance Overall balance assessment: Needs assistance Sitting-balance support: No upper extremity supported;Feet supported Sitting balance-Leahy Scale: Good     Standing balance support: Single extremity supported Standing balance-Leahy Scale: Poor Standing balance comment: reliant on UE support and close supervision                            Cognition Arousal/Alertness: Awake/alert Behavior During Therapy: WFL for tasks assessed/performed Overall Cognitive Status: Impaired/Different from baseline Area of Impairment: Safety/judgement;Awareness                         Safety/Judgement: Decreased awareness of deficits Awareness: Emergent          Exercises      General Comments General comments (skin integrity, edema, etc.): VSS on RA      Pertinent Vitals/Pain Pain Assessment: No/denies pain    Home Living  Prior Function            PT Goals (current goals can now be found in the care plan section) Acute Rehab PT Goals Patient Stated Goal: return to living independently Progress towards PT goals: Progressing toward goals    Frequency    Min 4X/week      PT Plan Current plan remains appropriate    Co-evaluation               AM-PAC PT "6 Clicks" Mobility   Outcome Measure  Help needed turning from your back to your side while in a flat bed without using bedrails?: None Help needed moving from lying on your back to sitting on the side of a flat bed without using bedrails?: None Help needed moving to and from a bed to a chair (including a wheelchair)?: A Little Help needed standing up from a chair using your arms (e.g., wheelchair or bedside chair)?: A Little Help needed to walk in hospital room?: A Lot Help needed climbing 3-5 steps with a railing? : Total 6 Click Score: 17    End of Session Equipment Utilized During Treatment: Gait belt Activity Tolerance: Patient tolerated treatment well Patient left: in chair;with call bell/phone within reach;with chair alarm set Nurse Communication: Mobility status PT Visit Diagnosis: Unsteadiness on feet (R26.81);Other abnormalities of gait and mobility (R26.89);Difficulty in walking, not elsewhere classified (R26.2);Hemiplegia and hemiparesis Hemiplegia - Right/Left: Left Hemiplegia - dominant/non-dominant: Dominant Hemiplegia - caused by: Cerebral infarction     Time: 8676-7209 PT Time Calculation (min) (ACUTE ONLY): 27 min  Charges:  $Gait Training: 8-22 mins $Therapeutic Activity: 8-22 mins                     Zenaida Niece, PT, DPT Acute Rehabilitation Pager: [phone]    Zenaida Niece 03/31/2020, 11:32 AM

## 2020-04-01 DIAGNOSIS — Z86010 Personal history of colonic polyps: Secondary | ICD-10-CM | POA: Diagnosis not present

## 2020-04-01 DIAGNOSIS — I69959 Hemiplegia and hemiparesis following unspecified cerebrovascular disease affecting unspecified side: Secondary | ICD-10-CM | POA: Diagnosis not present

## 2020-04-01 DIAGNOSIS — R5381 Other malaise: Secondary | ICD-10-CM | POA: Diagnosis not present

## 2020-04-01 DIAGNOSIS — R278 Other lack of coordination: Secondary | ICD-10-CM | POA: Diagnosis not present

## 2020-04-01 DIAGNOSIS — F17210 Nicotine dependence, cigarettes, uncomplicated: Secondary | ICD-10-CM | POA: Diagnosis not present

## 2020-04-01 DIAGNOSIS — H9193 Unspecified hearing loss, bilateral: Secondary | ICD-10-CM | POA: Diagnosis not present

## 2020-04-01 DIAGNOSIS — J342 Deviated nasal septum: Secondary | ICD-10-CM | POA: Diagnosis not present

## 2020-04-01 DIAGNOSIS — E119 Type 2 diabetes mellitus without complications: Secondary | ICD-10-CM | POA: Diagnosis not present

## 2020-04-01 DIAGNOSIS — K118 Other diseases of salivary glands: Secondary | ICD-10-CM | POA: Diagnosis not present

## 2020-04-01 DIAGNOSIS — H6123 Impacted cerumen, bilateral: Secondary | ICD-10-CM | POA: Diagnosis not present

## 2020-04-01 DIAGNOSIS — Z7401 Bed confinement status: Secondary | ICD-10-CM | POA: Diagnosis not present

## 2020-04-01 DIAGNOSIS — Z743 Need for continuous supervision: Secondary | ICD-10-CM | POA: Diagnosis not present

## 2020-04-01 DIAGNOSIS — M2550 Pain in unspecified joint: Secondary | ICD-10-CM | POA: Diagnosis not present

## 2020-04-01 DIAGNOSIS — I63521 Cerebral infarction due to unspecified occlusion or stenosis of right anterior cerebral artery: Secondary | ICD-10-CM | POA: Diagnosis not present

## 2020-04-01 DIAGNOSIS — R531 Weakness: Secondary | ICD-10-CM | POA: Diagnosis not present

## 2020-04-01 DIAGNOSIS — E43 Unspecified severe protein-calorie malnutrition: Secondary | ICD-10-CM | POA: Diagnosis not present

## 2020-04-01 DIAGNOSIS — I69354 Hemiplegia and hemiparesis following cerebral infarction affecting left non-dominant side: Secondary | ICD-10-CM | POA: Diagnosis not present

## 2020-04-01 DIAGNOSIS — J449 Chronic obstructive pulmonary disease, unspecified: Secondary | ICD-10-CM | POA: Diagnosis not present

## 2020-04-01 DIAGNOSIS — E46 Unspecified protein-calorie malnutrition: Secondary | ICD-10-CM | POA: Diagnosis not present

## 2020-04-01 DIAGNOSIS — R2681 Unsteadiness on feet: Secondary | ICD-10-CM | POA: Diagnosis not present

## 2020-04-01 DIAGNOSIS — E785 Hyperlipidemia, unspecified: Secondary | ICD-10-CM | POA: Diagnosis not present

## 2020-04-01 DIAGNOSIS — H9192 Unspecified hearing loss, left ear: Secondary | ICD-10-CM | POA: Diagnosis not present

## 2020-04-01 DIAGNOSIS — J343 Hypertrophy of nasal turbinates: Secondary | ICD-10-CM | POA: Diagnosis not present

## 2020-04-01 DIAGNOSIS — Z85038 Personal history of other malignant neoplasm of large intestine: Secondary | ICD-10-CM | POA: Diagnosis not present

## 2020-04-01 DIAGNOSIS — M6281 Muscle weakness (generalized): Secondary | ICD-10-CM | POA: Diagnosis not present

## 2020-04-01 DIAGNOSIS — Z9049 Acquired absence of other specified parts of digestive tract: Secondary | ICD-10-CM | POA: Diagnosis not present

## 2020-04-01 DIAGNOSIS — Z86718 Personal history of other venous thrombosis and embolism: Secondary | ICD-10-CM | POA: Diagnosis not present

## 2020-04-01 DIAGNOSIS — Z7901 Long term (current) use of anticoagulants: Secondary | ICD-10-CM | POA: Diagnosis not present

## 2020-04-01 MED ORDER — CLOPIDOGREL BISULFATE 75 MG PO TABS: 75.0000 mg | ORAL_TABLET | Freq: Every day | ORAL | 0 refills | Status: AC

## 2020-04-01 MED ORDER — ASPIRIN 81 MG PO TBEC: 81.0000 mg | DELAYED_RELEASE_TABLET | Freq: Every day | ORAL | 11 refills | Status: AC

## 2020-04-01 MED ORDER — ADULT MULTIVITAMIN W/MINERALS CH: 1.0000 | ORAL_TABLET | Freq: Every day | ORAL | Status: AC

## 2020-04-01 MED ORDER — ATORVASTATIN CALCIUM 80 MG PO TABS: 80.0000 mg | ORAL_TABLET | Freq: Every day | ORAL | Status: DC

## 2020-04-01 NOTE — Progress Notes (Signed)
Occupational Therapy Treatment Patient Details Name: JEREMY DITULLIO MRN: [id number] DOB: 01/04/1956 Today's Date: 04/01/2020    History of present illness The pt is a 64 yo male presenting with headache, L-sided weakness and dysarthria. Imaging revealed acute right corona radiata infarct in addition to chronic R pontine infarct. PMH includes: HTN, HLD, tobacco use, COPD, alcoholism, DVT, andcolon polyps s/p colectomy.    OT comments  Patient continues to make steady progress towards goals in skilled OT session. Patient's session encompassed neuromuscular re-education for LUE and usage of stylus and compensatory techniques with RUE in order to manipulate cell phone. Pt remains highly motivated, and is able to flex first digit on LUE in session to date. Educated extensively on various exercises to aid in LUE mobility as well as techniques for bimanual tasks. Demonstration and teach back noted; therapy will continue to follow.    Follow Up Recommendations  CIR    Equipment Recommendations  3 in 1 bedside commode;Other (comment) (TBD)    Recommendations for Other Services      Precautions / Restrictions Precautions Precautions: Fall Precaution Comments: colostomy bag, L sided weakness Restrictions Weight Bearing Restrictions: No LUE Weight Bearing: Weight bearing as tolerated       Mobility Bed Mobility Overal bed mobility: Needs Assistance Bed Mobility: Supine to Sit   Sidelying to sit: Min guard;HOB elevated       General bed mobility comments: Up in chair upon arrival  Transfers Overall transfer level: Needs assistance Equipment used: None Transfers: Sit to/from [facility] Sit to Stand: Min guard;Min assist Stand pivot transfers: Min assist       General transfer comment: Able to stand from EOB with cues for foot placement and placement of LUE on LLE for WB during transitions; stood from EOB x1, SPT bed to chair with Min A for balance.    Balance  Overall balance assessment: Needs assistance Sitting-balance support: Feet supported;No upper extremity supported Sitting balance-Leahy Scale: Good     Standing balance support: During functional activity Standing balance-Leahy Scale: Fair Standing balance comment: Able to stand statically focusing on equal WB through BLEs and weight shift with close min guard. Able to perform SLS on left withi min guard.                           ADL either performed or assessed with clinical judgement   ADL Overall ADL's : Needs assistance/impaired                                     Functional mobility during ADLs: Moderate assistance;Cueing for safety;Cueing for sequencing General ADL Comments: Session focus on neuromuscular re-ed and [facility]     Vision       Perception     Praxis      Cognition Arousal/Alertness: Awake/alert Behavior During Therapy: WFL for tasks assessed/performed Overall Cognitive Status: Impaired/Different from baseline Area of Impairment: Safety/judgement;Awareness                         Safety/Judgement: Decreased awareness of safety Awareness: Emergent   General Comments: Excited to share with therapist that he can move his left first digit today.        Exercises General Exercises - Upper Extremity Elbow Flexion: AAROM;Left;10 reps Elbow Extension: AAROM;Left;10 reps Digit Composite Flexion: AAROM;5 reps;Left Composite Extension: AAROM;5 reps;Left  Other Exercises Other Exercises: Performed 13 sit to stands from chair emphasizing proper foot placement and placing LUE on LLE for WB during transitions and anterior translation Other Exercises: Performed shrugs and scapular retraction x10   Shoulder Instructions       General Comments      Pertinent Vitals/ Pain       Pain Assessment: No/denies pain  Home Living                                          Prior Functioning/Environment               Frequency  Min 3X/week        Progress Toward Goals  OT Goals(current goals can now be found in the care plan section)  Progress towards OT goals: Progressing toward goals  Acute Rehab OT Goals Patient Stated Goal: return to living independently OT Goal Formulation: With patient Time For Goal Achievement: 04/12/20 Potential to Achieve Goals: Good  Plan Discharge plan remains appropriate    Co-evaluation                 AM-PAC OT "6 Clicks" Daily Activity     Outcome Measure   Help from another person eating meals?: A Little Help from another person taking care of personal grooming?: A Little Help from another person toileting, which includes using toliet, bedpan, or urinal?: A Lot Help from another person bathing (including washing, rinsing, drying)?: A Lot Help from another person to put on and taking off regular upper body clothing?: A Lot Help from another person to put on and taking off regular lower body clothing?: A Lot 6 Click Score: 14    End of Session    OT Visit Diagnosis: Hemiplegia and hemiparesis;Other abnormalities of gait and mobility (R26.89) Hemiplegia - Right/Left: Left Hemiplegia - dominant/non-dominant: Dominant Hemiplegia - caused by: Cerebral infarction   Activity Tolerance Patient tolerated treatment well   Patient Left in chair;with call bell/phone within reach;with chair alarm set   Nurse Communication Mobility status        Time: 1610-9604 OT Time Calculation (min): 22 min  Charges: OT General Charges $OT Visit: 1 Visit OT Treatments $Neuromuscular Re-education: 23-37 mins  Corinne [place], [place] Acute Rehabilitation Services [place] 04/01/2020, 2:15 PM

## 2020-04-01 NOTE — TOC Transition Note (Signed)
Transition of Care [facility]) - CM/SW Discharge Note   Patient Details  Name: SHON INDELICATO MRN: [id number] Date of Birth: 12-03-1955  Transition of [facility]) CM/SW Contact:  Bethann Berks[place], [place] Phone Number: 04/01/2020, 12:34 PM   Clinical Narrative:     Patient will DC to: [facility] [place]  Anticipated DC date: 10/29 Family notified: Pt notified son Chief Technology Officer by: Corey Harold   Per MD patient ready for DC to . RN, patient, and facility notified of DC. Discharge Summary and FL2 sent to facility. RN to call report prior to discharge [phone]). DC packet on chart. Ambulance transport requested for patient.   CSW will sign off for now as social work intervention is no longer needed. Please consult [place] again if new needs arise.   Final next level of care: Skilled Nursing Facility Barriers to Discharge: No Barriers Identified   Patient Goals and CMS Choice        Discharge Placement              Patient chooses bed at: [place] Patient to be transferred to facility by: [place] Name of family member notified: Pt notified his son Eddie Dibbles Patient and family notified of of transfer: 04/01/20  Discharge Plan and Services   Discharge Planning Services: CM Consult                                 Social Determinants of Health (SDOH) Interventions     Readmission Risk Interventions No flowsheet data found.

## 2020-04-01 NOTE — Plan of Care (Signed)
  Problem: Education: Goal: Knowledge of disease or condition will improve Outcome: Adequate for Discharge Goal: Knowledge of secondary prevention will improve Outcome: Adequate for Discharge Goal: Knowledge of patient specific risk factors addressed and post discharge goals established will improve Outcome: Adequate for Discharge   Problem: Coping: Goal: Will verbalize positive feelings about self Outcome: Adequate for Discharge Goal: Will identify appropriate support needs Outcome: Adequate for Discharge   Problem: Self-Care: Goal: Ability to participate in self-care as condition permits will improve Outcome: Adequate for Discharge   Problem: Nutrition: Goal: Risk of aspiration will decrease Outcome: Adequate for Discharge   Problem: Ischemic Stroke/TIA Tissue Perfusion: Goal: Complications of ischemic stroke/TIA will be minimized Outcome: Adequate for Discharge   Problem: Intracerebral Hemorrhage Tissue Perfusion: Goal: Complications of Intracerebral Hemorrhage will be minimized Outcome: Adequate for Discharge   Problem: Spontaneous Subarachnoid Hemorrhage Tissue Perfusion: Goal: Complications of Spontaneous Subarachnoid Hemorrhage will be minimized Outcome: Adequate for Discharge

## 2020-04-01 NOTE — Progress Notes (Signed)
Mahdi P Chastang  D/C'd to [facility]  per MD order.  Discussed with the patient and all questions fully answered. Report was called to [facility] and given to Bethann Goo, he verbalized understanding of all information given to him and all questions asked were answered.  VSS, Skin clean, dry and intact without evidence of skin break down, no evidence of skin tears noted. IV catheter discontinued intact. Site without signs and symptoms of complications. Dressing and pressure applied.  An After Visit Summary was printed and given to PTAR   Patient instructed to return to ED, call 911, or call MD for any changes in condition.   Patient taken out of the unit via [facility] , and D/C [facility] via ambulance at 1550.  Dorris Carnes 04/01/2020 3:54 PM

## 2020-04-01 NOTE — Progress Notes (Signed)
Physical Therapy Treatment Patient Details Name: [name] MRN: [id number] DOB: 10/18/1955 Today's Date: 04/01/2020    History of Present Illness The pt is a 64 yo male presenting with headache, L-sided weakness and dysarthria. Imaging revealed acute right corona radiata infarct in addition to chronic R pontine infarct. PMH includes: HTN, HLD, tobacco use, COPD, alcoholism, DVT, andcolon polyps s/p colectomy.     PT Comments    Patient progressing well towards PT goals. Worked on gait training with handrail in hallway requiring Min A for support, balance and safety. Focused on functional sit to stands from chair with emphasis on equal WB through BLEs and WB through LUE/LLE during transitions. Pt able to move first digit on left hand today. Highly motivated. Would be a great rehab candidate. Will continue to follow.    Follow Up Recommendations  SNF     Equipment Recommendations  None recommended by PT    Recommendations for Other Services       Precautions / Restrictions Precautions Precautions: Fall Precaution Comments: colostomy bag, L sided weakness Restrictions Weight Bearing Restrictions: No    Mobility  Bed Mobility Overal bed mobility: Needs Assistance Bed Mobility: Supine to Sit   Sidelying to sit: Min guard;HOB elevated       General bed mobility comments: Use of rail to get to EOB.  Transfers Overall transfer level: Needs assistance Equipment used: None Transfers: Sit to/from [facility] Sit to Stand: Min guard;Min assist Stand pivot transfers: Min assist       General transfer comment: Able to stand from EOB with cues for foot placement and placement of LUE on LLE for WB during transitions; stood from EOB x1, SPT bed to chair with Min A for balance.  Ambulation/Gait Ambulation/Gait assistance: Min assist Gait Distance (Feet): 60 Feet (x2 bouts) Assistive device: 1 person hand held assist;None (rail in hallway) Gait  Pattern/deviations: Step-through pattern;Narrow base of support;Decreased step length - left Gait velocity: reduced Gait velocity interpretation: <1.8 ft/sec, indicate of risk for recurrent falls General Gait Details: Utilized rail in hallway for gait training with cues to increase step length on left and to widen BoS as well as forward gaze. 1 seated rest break. Decreased foot clearance on left with fatigue. 1 instance of LOB forward due to trying to go too fast, pt aware.   Stairs             Wheelchair Mobility    Modified Rankin (Stroke Patients Only) Modified Rankin (Stroke Patients Only) Pre-Morbid Rankin Score: No symptoms Modified Rankin: Moderately severe disability     Balance Overall balance assessment: Needs assistance Sitting-balance support: Feet supported;No upper extremity supported Sitting balance-Leahy Scale: Good     Standing balance support: During functional activity Standing balance-Leahy Scale: Fair Standing balance comment: Able to stand statically focusing on equal WB through BLEs and weight shift with close min guard. Able to perform SLS on left withi min guard.                            Cognition Arousal/Alertness: Awake/alert Behavior During Therapy: WFL for tasks assessed/performed   Area of Impairment: Safety/judgement;Awareness                         Safety/Judgement: Decreased awareness of safety Awareness: Emergent   General Comments: Excited to share with therapist that he can move his left first digit today.Able to recall things from  PT session yesterday.      Exercises Other Exercises Other Exercises: Performed 13 sit to stands from chair emphasizing proper foot placement and placing LUE on LLE for WB during transitions and anterior translation Other Exercises: PErformed shrugs and scapular retraction x10    General Comments        Pertinent Vitals/Pain Pain Assessment: No/denies pain    Home Living                       Prior Function            PT Goals (current goals can now be found in the care plan section) Progress towards PT goals: Progressing toward goals    Frequency    Min 4X/week      PT Plan Current plan remains appropriate    Co-evaluation              AM-PAC PT "6 Clicks" Mobility   Outcome Measure  Help needed turning from your back to your side while in a flat bed without using bedrails?: None Help needed moving from lying on your back to sitting on the side of a flat bed without using bedrails?: None Help needed moving to and from a bed to a chair (including a wheelchair)?: A Little Help needed standing up from a chair using your arms (e.g., wheelchair or bedside chair)?: A Little Help needed to walk in hospital room?: A Little Help needed climbing 3-5 steps with a railing? : Total 6 Click Score: 18    End of Session Equipment Utilized During Treatment: Gait belt Activity Tolerance: Patient tolerated treatment well Patient left: in chair;with call bell/phone within reach;with chair alarm set Nurse Communication: Mobility status PT Visit Diagnosis: Unsteadiness on feet (R26.81);Other abnormalities of gait and mobility (R26.89);Difficulty in walking, not elsewhere classified (R26.2);Hemiplegia and hemiparesis Hemiplegia - Right/Left: Left Hemiplegia - dominant/non-dominant: Dominant Hemiplegia - caused by: Cerebral infarction     Time: 0931-1002 PT Time Calculation (min) (ACUTE ONLY): 31 min  Charges:  $Gait Training: 8-22 mins $Neuromuscular Re-education: 8-22 mins                     Marisa Severin, PT, DPT Acute Rehabilitation Services Pager [phone] Office [place] 04/01/2020, 10:39 AM

## 2020-04-03 DIAGNOSIS — E46 Unspecified protein-calorie malnutrition: Secondary | ICD-10-CM | POA: Diagnosis not present

## 2020-04-03 DIAGNOSIS — I69959 Hemiplegia and hemiparesis following unspecified cerebrovascular disease affecting unspecified side: Secondary | ICD-10-CM | POA: Diagnosis not present

## 2020-04-13 DIAGNOSIS — H6123 Impacted cerumen, bilateral: Secondary | ICD-10-CM | POA: Diagnosis not present

## 2020-04-13 DIAGNOSIS — J343 Hypertrophy of nasal turbinates: Secondary | ICD-10-CM | POA: Diagnosis not present

## 2020-04-13 DIAGNOSIS — H9193 Unspecified hearing loss, bilateral: Secondary | ICD-10-CM | POA: Diagnosis not present

## 2020-04-13 DIAGNOSIS — J342 Deviated nasal septum: Secondary | ICD-10-CM | POA: Diagnosis not present

## 2020-04-13 DIAGNOSIS — K118 Other diseases of salivary glands: Secondary | ICD-10-CM | POA: Diagnosis not present

## 2020-04-18 ENCOUNTER — Encounter (HOSPITAL_COMMUNITY): Payer: Self-pay | Admitting: Radiology

## 2020-04-18 ENCOUNTER — Other Ambulatory Visit (HOSPITAL_COMMUNITY): Payer: Self-pay | Admitting: Otolaryngology

## 2020-04-18 DIAGNOSIS — R221 Localized swelling, mass and lump, neck: Secondary | ICD-10-CM

## 2020-04-18 DIAGNOSIS — K118 Other diseases of salivary glands: Secondary | ICD-10-CM

## 2020-04-18 NOTE — Progress Notes (Unsigned)
Vinnie Level Male, 64 y.o., 1956-03-25 MRN:  [id number] Phone:  [phone] (H) PCP:  Elizabeth Palau, MD Primary Cvg:  Medicare/Medicare Part A And B  RE: [place] FNA SALIVARY GLAND/PAROTID GLAND Received: Today Arne Cleveland, MD  Arlyn Leak   [place] FNA L parotid nodule  1.8cm on neck MRA   DDH       Previous Messages   ----- Message -----  From: Garth Bigness D  Sent: 04/18/2020  2:03 PM EST  To: Ir Procedure Requests  Subject: [place] FNA SALIVARY GLAND/PAROTID GLAND        Procedure: [place] FNA SALIVARY GLAND/PAROTID GLAND   Reason: left parotid mass, Localized swelling, mass and lump, neck   History:  MR in computer   Provider: Ebbie Latus A   Provider Contact: [phone]

## 2020-04-24 DIAGNOSIS — Z7982 Long term (current) use of aspirin: Secondary | ICD-10-CM | POA: Diagnosis not present

## 2020-04-24 DIAGNOSIS — F17210 Nicotine dependence, cigarettes, uncomplicated: Secondary | ICD-10-CM | POA: Diagnosis not present

## 2020-04-24 DIAGNOSIS — Z7902 Long term (current) use of antithrombotics/antiplatelets: Secondary | ICD-10-CM | POA: Diagnosis not present

## 2020-04-24 DIAGNOSIS — K118 Other diseases of salivary glands: Secondary | ICD-10-CM | POA: Diagnosis not present

## 2020-04-24 DIAGNOSIS — H532 Diplopia: Secondary | ICD-10-CM | POA: Diagnosis not present

## 2020-04-24 DIAGNOSIS — I10 Essential (primary) hypertension: Secondary | ICD-10-CM | POA: Diagnosis not present

## 2020-04-24 DIAGNOSIS — I69352 Hemiplegia and hemiparesis following cerebral infarction affecting left dominant side: Secondary | ICD-10-CM | POA: Diagnosis not present

## 2020-04-24 DIAGNOSIS — Z89022 Acquired absence of left finger(s): Secondary | ICD-10-CM | POA: Diagnosis not present

## 2020-04-24 DIAGNOSIS — E785 Hyperlipidemia, unspecified: Secondary | ICD-10-CM | POA: Diagnosis not present

## 2020-04-24 DIAGNOSIS — Z86718 Personal history of other venous thrombosis and embolism: Secondary | ICD-10-CM | POA: Diagnosis not present

## 2020-04-24 DIAGNOSIS — H9192 Unspecified hearing loss, left ear: Secondary | ICD-10-CM | POA: Diagnosis not present

## 2020-04-24 DIAGNOSIS — G319 Degenerative disease of nervous system, unspecified: Secondary | ICD-10-CM | POA: Diagnosis not present

## 2020-04-24 DIAGNOSIS — K269 Duodenal ulcer, unspecified as acute or chronic, without hemorrhage or perforation: Secondary | ICD-10-CM | POA: Diagnosis not present

## 2020-04-24 DIAGNOSIS — Z85038 Personal history of other malignant neoplasm of large intestine: Secondary | ICD-10-CM | POA: Diagnosis not present

## 2020-04-24 DIAGNOSIS — J449 Chronic obstructive pulmonary disease, unspecified: Secondary | ICD-10-CM | POA: Diagnosis not present

## 2020-04-24 DIAGNOSIS — E441 Mild protein-calorie malnutrition: Secondary | ICD-10-CM | POA: Diagnosis not present

## 2020-04-24 DIAGNOSIS — M1712 Unilateral primary osteoarthritis, left knee: Secondary | ICD-10-CM | POA: Diagnosis not present

## 2020-04-24 DIAGNOSIS — Z86010 Personal history of colonic polyps: Secondary | ICD-10-CM | POA: Diagnosis not present

## 2020-04-25 ENCOUNTER — Other Ambulatory Visit: Payer: Self-pay | Admitting: Radiology

## 2020-04-25 ENCOUNTER — Ambulatory Visit (HOSPITAL_COMMUNITY): Payer: Medicare Other

## 2020-04-25 ENCOUNTER — Encounter: Payer: Self-pay | Admitting: Physical Medicine & Rehabilitation

## 2020-04-25 DIAGNOSIS — G319 Degenerative disease of nervous system, unspecified: Secondary | ICD-10-CM | POA: Diagnosis not present

## 2020-04-25 DIAGNOSIS — M1712 Unilateral primary osteoarthritis, left knee: Secondary | ICD-10-CM | POA: Diagnosis not present

## 2020-04-25 DIAGNOSIS — H532 Diplopia: Secondary | ICD-10-CM | POA: Diagnosis not present

## 2020-04-25 DIAGNOSIS — J449 Chronic obstructive pulmonary disease, unspecified: Secondary | ICD-10-CM | POA: Diagnosis not present

## 2020-04-25 DIAGNOSIS — Z86718 Personal history of other venous thrombosis and embolism: Secondary | ICD-10-CM | POA: Diagnosis not present

## 2020-04-25 DIAGNOSIS — Z7902 Long term (current) use of antithrombotics/antiplatelets: Secondary | ICD-10-CM | POA: Diagnosis not present

## 2020-04-25 DIAGNOSIS — Z7982 Long term (current) use of aspirin: Secondary | ICD-10-CM | POA: Diagnosis not present

## 2020-04-25 DIAGNOSIS — Z86010 Personal history of colonic polyps: Secondary | ICD-10-CM | POA: Diagnosis not present

## 2020-04-25 DIAGNOSIS — E441 Mild protein-calorie malnutrition: Secondary | ICD-10-CM | POA: Diagnosis not present

## 2020-04-25 DIAGNOSIS — K118 Other diseases of salivary glands: Secondary | ICD-10-CM | POA: Diagnosis not present

## 2020-04-25 DIAGNOSIS — I10 Essential (primary) hypertension: Secondary | ICD-10-CM | POA: Diagnosis not present

## 2020-04-25 DIAGNOSIS — Z85038 Personal history of other malignant neoplasm of large intestine: Secondary | ICD-10-CM | POA: Diagnosis not present

## 2020-04-25 DIAGNOSIS — I69352 Hemiplegia and hemiparesis following cerebral infarction affecting left dominant side: Secondary | ICD-10-CM | POA: Diagnosis not present

## 2020-04-25 DIAGNOSIS — E785 Hyperlipidemia, unspecified: Secondary | ICD-10-CM | POA: Diagnosis not present

## 2020-04-25 DIAGNOSIS — H9192 Unspecified hearing loss, left ear: Secondary | ICD-10-CM | POA: Diagnosis not present

## 2020-04-25 DIAGNOSIS — F17210 Nicotine dependence, cigarettes, uncomplicated: Secondary | ICD-10-CM | POA: Diagnosis not present

## 2020-04-25 DIAGNOSIS — Z89022 Acquired absence of left finger(s): Secondary | ICD-10-CM | POA: Diagnosis not present

## 2020-04-25 DIAGNOSIS — K269 Duodenal ulcer, unspecified as acute or chronic, without hemorrhage or perforation: Secondary | ICD-10-CM | POA: Diagnosis not present

## 2020-04-26 ENCOUNTER — Other Ambulatory Visit (HOSPITAL_COMMUNITY): Payer: Medicare Other

## 2020-04-27 ENCOUNTER — Ambulatory Visit (HOSPITAL_COMMUNITY)
Admission: RE | Admit: 2020-04-27 | Discharge: 2020-04-27 | Disposition: A | Discharge status: Home / Self Care | Payer: Medicare Other | Source: Ambulatory Visit | Attending: Otolaryngology | Admitting: Otolaryngology

## 2020-04-27 ENCOUNTER — Other Ambulatory Visit: Payer: Self-pay

## 2020-04-27 ENCOUNTER — Encounter (HOSPITAL_COMMUNITY): Payer: Self-pay

## 2020-04-27 DIAGNOSIS — R221 Localized swelling, mass and lump, neck: Secondary | ICD-10-CM | POA: Diagnosis not present

## 2020-04-27 DIAGNOSIS — Z79899 Other long term (current) drug therapy: Secondary | ICD-10-CM | POA: Diagnosis not present

## 2020-04-27 DIAGNOSIS — Z8673 Personal history of transient ischemic attack (TIA), and cerebral infarction without residual deficits: Secondary | ICD-10-CM | POA: Insufficient documentation

## 2020-04-27 DIAGNOSIS — F17210 Nicotine dependence, cigarettes, uncomplicated: Secondary | ICD-10-CM | POA: Insufficient documentation

## 2020-04-27 DIAGNOSIS — K118 Other diseases of salivary glands: Secondary | ICD-10-CM | POA: Diagnosis not present

## 2020-04-27 DIAGNOSIS — Z7982 Long term (current) use of aspirin: Secondary | ICD-10-CM | POA: Diagnosis not present

## 2020-04-27 LAB — CBC
HCT: 46.6 % (ref 39.0–52.0)
Hemoglobin: 16.1 g/dL (ref 13.0–17.0)
MCH: 32.1 pg (ref 26.0–34.0)
MCHC: 34.5 g/dL (ref 30.0–36.0)
MCV: 93 fL (ref 80.0–100.0)
Platelets: 498 10*3/uL — ABNORMAL HIGH (ref 150–400)
RBC: 5.01 MIL/uL (ref 4.22–5.81)
RDW: 11.2 % — ABNORMAL LOW (ref 11.5–15.5)
WBC: 7.7 10*3/uL (ref 4.0–10.5)
nRBC: 0 % (ref 0.0–0.2)

## 2020-04-27 LAB — PROTIME-INR
INR: 1.1 (ref 0.8–1.2)
Prothrombin Time: 13.4 seconds (ref 11.4–15.2)

## 2020-04-27 MED ORDER — MIDAZOLAM HCL 2 MG/2ML IJ SOLN
INTRAMUSCULAR | Status: AC | PRN
  Administered 2020-04-27: 1 mg via INTRAVENOUS

## 2020-04-27 MED ORDER — FENTANYL CITRATE (PF) 100 MCG/2ML IJ SOLN
INTRAMUSCULAR | Status: AC | PRN
Start: 2020-04-27 — End: 2020-04-27
  Administered 2020-04-27: 50 ug via INTRAVENOUS

## 2020-04-27 MED ORDER — SODIUM CHLORIDE 0.9 % IV SOLN: INTRAVENOUS | Status: DC

## 2020-04-27 MED ORDER — HYDROCODONE-ACETAMINOPHEN 5-325 MG PO TABS: 1.0000 | ORAL_TABLET | ORAL | Status: DC | PRN

## 2020-04-27 MED ORDER — MIDAZOLAM HCL 2 MG/2ML IJ SOLN
INTRAMUSCULAR | Status: AC
  Filled 2020-04-27: qty 2

## 2020-04-27 MED ORDER — FENTANYL CITRATE (PF) 100 MCG/2ML IJ SOLN
INTRAMUSCULAR | Status: AC
  Filled 2020-04-27: qty 2

## 2020-04-27 MED ORDER — LIDOCAINE HCL (PF) 1 % IJ SOLN
INTRAMUSCULAR | Status: AC
  Filled 2020-04-27: qty 30

## 2020-04-27 NOTE — Procedures (Signed)
  Procedure: [place] FNA bx L parotid nodule 25g x4 EBL:   minimal Complications:  none immediate  See full dictation in [facility].  Dillard Cannon MD Main # [phone] Pager  [phone] Mobile [phone]

## 2020-04-27 NOTE — Discharge Instructions (Addendum)

## 2020-04-27 NOTE — H&P (Signed)
Chief Complaint: Patient was seen in consultation today for left parotid mass biopsy at the request of Cecil A  Referring Physician(s): [place] A  Supervising Physician: Arne Cleveland  Patient Status: [facility] - Out-pt  History of Present Illness: [name] is a 64 y.o. 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 Has been stating at [place] and was discharged home just 04/22/20.  Was referred to Dr Fredric Dine-- for evaluation of mass He denies pain or growth of mass He is able to palpate mass +smoker  Now request for biopsy of Left parotid mass  He has discontinued Plavix 11/17 Has not restarted per pt.   Past Medical History:  Diagnosis Date  . Abdominal pain   . Alcoholism ([place])   . Arthritis   . COPD (chronic obstructive pulmonary disease) ([place])   . Depression   . Diarrhea   . Emphysema   . Fibromyalgia   . Fibromyalgia 1996  . GERD (gastroesophageal reflux disease)   . Gum disease   . Hearing loss   . Hx of blood clots   . Hx of colonic polyps 11/30/2010   tubular adenomas; had prior colectomy  . Hyperlipidemia   . Hypertension   . IBS (irritable bowel syndrome)   . Nausea   . Night sweats   . Vomiting     Past Surgical History:  Procedure Laterality Date  . APPENDECTOMY    . COLECTOMY     inguinal rectal colectomy  . COLONOSCOPY    . FOOT SURGERY     left  . HAND SURGERY     for left finger amputations  . HEMICOLECTOMY  01/24/11  . NOSE SURGERY    . POLYPECTOMY    . PROCTOSCOPY  01/24/11    Allergies: Iohexol  Medications: Prior to Admission medications   Medication Sig Start Date End Date Taking? Authorizing Provider  acetaminophen (TYLENOL) 500 MG tablet Take 1,000 mg by mouth every 6 (six) hours as needed for moderate pain or headache.   Yes [provider]  aspirin EC 81 MG EC tablet Take 1 tablet (81 mg total) by mouth daily. Swallow  whole. 04/01/20  Yes Donzetta Starch, NP  atorvastatin (LIPITOR) 80 MG tablet Take 1 tablet (80 mg total) by mouth daily. Patient taking differently: Take 80 mg by mouth at bedtime.  04/01/20  Yes Donzetta Starch, NP  carbamide peroxide (DEBROX) 6.5 % OTIC solution Place 4 drops into the left ear daily.   Yes [provider]  Multiple Vitamin (MULTIVITAMIN WITH MINERALS) TABS tablet Take 1 tablet by mouth daily. 04/02/20  Yes Donzetta Starch, NP  tiotropium (SPIRIVA) 18 MCG inhalation capsule Place 18 mcg into inhaler and inhale daily.    Yes [provider]  tiZANidine (ZANAFLEX) 2 MG tablet Take 2 mg by mouth 3 (three) times daily.    Yes [provider]     Family History  Problem Relation Age of Onset  . Hypertension Mother   . Diabetes Mother   . Breast cancer Mother   . Breast cancer Sister   . Hypertension Sister   . Stomach cancer Maternal Grandfather   . Hypertension Sister   . Colon cancer Neg Hx     Social History   Socioeconomic History  . Marital status: Single    Spouse name: Not on file  . Number of children: 1  . Years of education: Not on file  .  Highest education level: Not on file  Occupational History  . Occupation: Self Employed   Tobacco Use  . Smoking status: Current Every Day Smoker    Packs/day: 1.00    Types: Cigarettes  . Smokeless tobacco: Never Used  Substance and Sexual Activity  . Alcohol use: No    Comment: no alcohol since 01-07-13  . Drug use: No  . Sexual activity: Not on file  Other Topics Concern  . Not on file  Social History Narrative   Daily caffeine    Social Determinants of Health   Financial Resource Strain:   . Difficulty of Paying Living Expenses: Not on file  Food Insecurity:   . Worried About Charity fundraiser in the Last Year: Not on file  . Ran Out of Food in the Last Year: Not on file  Transportation Needs:   . Lack of Transportation (Medical): Not on file  . Lack of Transportation  (Non-Medical): Not on file  Physical Activity:   . Days of Exercise per Week: Not on file  . Minutes of Exercise per Session: Not on file  Stress:   . Feeling of Stress : Not on file  Social Connections:   . Frequency of Communication with Friends and Family: Not on file  . Frequency of Social Gatherings with Friends and Family: Not on file  . Attends Religious Services: Not on file  . Active Member of Clubs or Organizations: Not on file  . Attends Archivist Meetings: Not on file  . Marital Status: Not on file   Review of Systems: A 12 point ROS discussed and pertinent positives are indicated in the HPI above.  All other systems are negative.  Review of Systems  Constitutional: Negative for activity change, fatigue and fever.  HENT: Negative for sore throat and trouble swallowing.   Respiratory: Negative for cough and shortness of breath.   Cardiovascular: Negative for chest pain.  Gastrointestinal: Negative for nausea.  Neurological: Negative for weakness.  Psychiatric/Behavioral: Negative for behavioral problems and confusion.    Vital Signs: BP 138/86   Pulse 62   Temp 97.7 F (36.5 C) (Oral)   Resp 16   Ht 5\' 11"  (1.803 m)   Wt 157 lb (71.2 kg)   SpO2 96%   BMI 21.90 kg/m   Physical Exam Vitals reviewed.  HENT:     Head:     Comments: Left facial droop    Mouth/Throat:     Mouth: Mucous membranes are moist.  Cardiovascular:     Rate and Rhythm: Normal rate and regular rhythm.     Heart sounds: Normal heart sounds.  Pulmonary:     Effort: Pulmonary effort is normal.     Breath sounds: Normal breath sounds.  Abdominal:     Palpations: Abdomen is soft.  Musculoskeletal:     Comments: Left arm weak after CVA Moves all other extr well Follows all commands  Skin:    General: Skin is warm.  Neurological:     Mental Status: He is alert and oriented to person, place, and time.  Psychiatric:        Behavior: Behavior normal.     Imaging: CT HEAD  WO CONTRAST  Result Date: 03/29/2020 CLINICAL DATA:  Stroke follow-up, 24 hour status post tPA administration. EXAM: CT HEAD WITHOUT CONTRAST TECHNIQUE: Contiguous axial images were obtained from the base of the skull through the vertex without intravenous contrast. COMPARISON:  Head CT 03/28/2020 FINDINGS: Brain: Expected evolution of  subacute right basal ganglia small vessel infarct. No intracranial hemorrhage. No midline shift or mass effect. Vascular: No abnormal hyperdensity of the major intracranial arteries or dural venous sinuses. No intracranial atherosclerosis. Skull: The visualized skull base, calvarium and extracranial soft tissues are normal. Sinuses/Orbits: No fluid levels or advanced mucosal thickening of the visualized paranasal sinuses. No mastoid or middle ear effusion. The orbits are normal. IMPRESSION: Expected evolution of subacute right basal ganglia small vessel infarct without acute intracranial hemorrhage. Electronically Signed   By: Ulyses Jarred M.D.   On: 03/29/2020 03:58   ECHOCARDIOGRAM COMPLETE  Result Date: 03/28/2020    ECHOCARDIOGRAM REPORT   Patient Name:   EMMAUS BRANDI Ellefson Date of Exam: 03/28/2020 Medical Rec #:  [id number]       Height:       71.0 in Accession #:    [id number]      Weight:       162.0 lb Date of Birth:  [birth year]       BSA:          1.928 m Patient Age:    50 years        BP:           129/85 mmHg Patient Gender: M               HR:           74 bpm. Exam Location:  Inpatient Procedure: 2D Echo Indications:    stroke 434.91  History:        Patient has no prior history of Echocardiogram examinations.                 COPD; Risk Factors:Current Smoker, Hypertension and                 Dyslipidemia. ETOH ABUSE.  Sonographer:    Jannett Celestine RDCS (AE) Referring Phys: [id number] [place]  1. Left ventricular ejection fraction, by estimation, is 60 to 65%. The left ventricle has normal function. The left ventricle has no regional wall motion  abnormalities. Left ventricular diastolic parameters are consistent with Grade I diastolic dysfunction (impaired relaxation).  2. Right ventricular systolic function is normal. The right ventricular size is normal.  3. The mitral valve is grossly normal. No evidence of mitral valve regurgitation.  4. The aortic valve was not well visualized. Aortic valve regurgitation is not visualized. No aortic stenosis is present.  5. The inferior vena cava is normal in size with greater than 50% respiratory variability, suggesting right atrial pressure of 3 mmHg. Comparison(s): No prior Echocardiogram. Conclusion(s)/Recommendation(s): Normal biventricular function without evidence of hemodynamically significant valvular heart disease. FINDINGS  Left Ventricle: Left ventricular ejection fraction, by estimation, is 60 to 65%. The left ventricle has normal function. The left ventricle has no regional wall motion abnormalities. The left ventricular internal cavity size was normal in size. There is  no left ventricular hypertrophy. Left ventricular diastolic parameters are consistent with Grade I diastolic dysfunction (impaired relaxation). Right Ventricle: The right ventricular size is normal. No increase in right ventricular wall thickness. Right ventricular systolic function is normal. Left Atrium: Left atrial size was normal in size. Right Atrium: Right atrial size was normal in size. Pericardium: There is no evidence of pericardial effusion. Mitral Valve: The mitral valve is grossly normal. There is mild thickening of the mitral valve leaflet(s). No evidence of mitral valve regurgitation. Tricuspid Valve: The tricuspid valve is grossly normal. Tricuspid valve regurgitation is not demonstrated.  Aortic Valve: The aortic valve was not well visualized. Aortic valve regurgitation is not visualized. No aortic stenosis is present. Pulmonic Valve: The pulmonic valve was not well visualized. Pulmonic valve regurgitation is not  visualized. Aorta: The aortic root is normal in size and structure and the ascending aorta was not well visualized. Venous: The pulmonary veins were not well visualized. The inferior vena cava is normal in size with greater than 50% respiratory variability, suggesting right atrial pressure of 3 mmHg. IAS/Shunts: The atrial septum is grossly normal.  LEFT VENTRICLE PLAX 2D LVIDd:         4.70 cm  Diastology LVIDs:         2.70 cm  LV e' medial:    5.98 cm/s LV PW:         1.00 cm  LV E/e' medial:  10.4 LV IVS:        0.70 cm  LV e' lateral:   6.53 cm/s LVOT diam:     2.00 cm  LV E/e' lateral: 9.5 LV SV:         67 LV SV Index:   35 LVOT Area:     3.14 cm  RIGHT VENTRICLE RV S prime:     14.10 cm/s TAPSE (M-mode): 1.4 cm LEFT ATRIUM           Index LA diam:      2.30 cm 1.19 cm/m LA Vol (A2C): 18.4 ml 9.54 ml/m  AORTIC VALVE LVOT Vmax:   96.60 cm/s LVOT Vmean:  63.000 cm/s LVOT VTI:    0.214 m  AORTA Ao Root diam: 3.10 cm MITRAL VALVE MV Area (PHT): 2.83 cm    SHUNTS MV Decel Time: 268 msec    Systemic VTI:  0.21 m MV E velocity: 62.10 cm/s  Systemic Diam: 2.00 cm MV A velocity: 74.60 cm/s MV E/A ratio:  0.83 Rudean Haskell MD Electronically signed by Rudean Haskell MD Signature Date/Time: 03/28/2020/4:33:28 PM    Final     Labs:  CBC: Recent Labs    03/28/20 0409 03/28/20 0411 03/28/20 1114  WBC 8.4  --  11.6*  HGB 16.7 16.3 16.3  HCT 48.8 48.0 47.8  PLT 371  --  399    COAGS: Recent Labs    03/28/20 0409  INR 1.0  APTT 26    BMP: Recent Labs    03/28/20 0409 03/28/20 0411  NA 140 143  K 3.9 3.9  CL 106 105  CO2 24  --   GLUCOSE 123* 119*  BUN 6* 5*  CALCIUM 9.3  --   CREATININE 0.94 0.90  GFRNONAA >60  --     LIVER FUNCTION TESTS: Recent Labs    03/28/20 0409  BILITOT 0.8  AST 38  ALT 50*  ALKPHOS 52  PROT 6.9  ALBUMIN 3.8    TUMOR MARKERS: No results for input(s): AFPTM, CEA, CA199, CHROMGRNA in the last 8760 hours.  Assessment and Plan:  CVA  10/21 Noted incidental left parotid mass Scheduled now for biopsy of same +smoker Off Plavix 5 days Risks and benefits of left parotid mass bx was discussed with the patient and/or patient's family including, but not limited to bleeding, infection, damage to adjacent structures or low yield requiring additional tests.  All of the questions were answered and there is agreement to proceed. Consent signed and in chart.   Thank you for this interesting consult.  I greatly enjoyed meeting Jaymes P Radloff and look forward to participating in their  care.  A copy of this report was sent to the requesting provider on this date.  Electronically Signed: Lavonia Drafts, PA-C 04/27/2020, 11:47 AM   I spent a total of  30 Minutes   in face to face in clinical consultation, greater than 50% of which was counseling/coordinating care for left parotid mass bx

## 2020-04-29 DIAGNOSIS — H532 Diplopia: Secondary | ICD-10-CM | POA: Diagnosis not present

## 2020-04-29 DIAGNOSIS — J449 Chronic obstructive pulmonary disease, unspecified: Secondary | ICD-10-CM | POA: Diagnosis not present

## 2020-04-29 DIAGNOSIS — Z89022 Acquired absence of left finger(s): Secondary | ICD-10-CM | POA: Diagnosis not present

## 2020-04-29 DIAGNOSIS — Z86010 Personal history of colonic polyps: Secondary | ICD-10-CM | POA: Diagnosis not present

## 2020-04-29 DIAGNOSIS — I10 Essential (primary) hypertension: Secondary | ICD-10-CM | POA: Diagnosis not present

## 2020-04-29 DIAGNOSIS — Z7982 Long term (current) use of aspirin: Secondary | ICD-10-CM | POA: Diagnosis not present

## 2020-04-29 DIAGNOSIS — Z85038 Personal history of other malignant neoplasm of large intestine: Secondary | ICD-10-CM | POA: Diagnosis not present

## 2020-04-29 DIAGNOSIS — K269 Duodenal ulcer, unspecified as acute or chronic, without hemorrhage or perforation: Secondary | ICD-10-CM | POA: Diagnosis not present

## 2020-04-29 DIAGNOSIS — K118 Other diseases of salivary glands: Secondary | ICD-10-CM | POA: Diagnosis not present

## 2020-04-29 DIAGNOSIS — H9192 Unspecified hearing loss, left ear: Secondary | ICD-10-CM | POA: Diagnosis not present

## 2020-04-29 DIAGNOSIS — I69352 Hemiplegia and hemiparesis following cerebral infarction affecting left dominant side: Secondary | ICD-10-CM | POA: Diagnosis not present

## 2020-04-29 DIAGNOSIS — Z7902 Long term (current) use of antithrombotics/antiplatelets: Secondary | ICD-10-CM | POA: Diagnosis not present

## 2020-04-29 DIAGNOSIS — E785 Hyperlipidemia, unspecified: Secondary | ICD-10-CM | POA: Diagnosis not present

## 2020-04-29 DIAGNOSIS — M1712 Unilateral primary osteoarthritis, left knee: Secondary | ICD-10-CM | POA: Diagnosis not present

## 2020-04-29 DIAGNOSIS — G319 Degenerative disease of nervous system, unspecified: Secondary | ICD-10-CM | POA: Diagnosis not present

## 2020-04-29 DIAGNOSIS — F17210 Nicotine dependence, cigarettes, uncomplicated: Secondary | ICD-10-CM | POA: Diagnosis not present

## 2020-04-29 DIAGNOSIS — Z86718 Personal history of other venous thrombosis and embolism: Secondary | ICD-10-CM | POA: Diagnosis not present

## 2020-04-29 DIAGNOSIS — E441 Mild protein-calorie malnutrition: Secondary | ICD-10-CM | POA: Diagnosis not present

## 2020-04-29 LAB — CYTOLOGY - NON PAP

## 2020-05-02 DIAGNOSIS — Z7982 Long term (current) use of aspirin: Secondary | ICD-10-CM | POA: Diagnosis not present

## 2020-05-02 DIAGNOSIS — Z89022 Acquired absence of left finger(s): Secondary | ICD-10-CM | POA: Diagnosis not present

## 2020-05-02 DIAGNOSIS — H532 Diplopia: Secondary | ICD-10-CM | POA: Diagnosis not present

## 2020-05-02 DIAGNOSIS — E441 Mild protein-calorie malnutrition: Secondary | ICD-10-CM | POA: Diagnosis not present

## 2020-05-02 DIAGNOSIS — H9192 Unspecified hearing loss, left ear: Secondary | ICD-10-CM | POA: Diagnosis not present

## 2020-05-02 DIAGNOSIS — Z85038 Personal history of other malignant neoplasm of large intestine: Secondary | ICD-10-CM | POA: Diagnosis not present

## 2020-05-02 DIAGNOSIS — E785 Hyperlipidemia, unspecified: Secondary | ICD-10-CM | POA: Diagnosis not present

## 2020-05-02 DIAGNOSIS — Z86010 Personal history of colonic polyps: Secondary | ICD-10-CM | POA: Diagnosis not present

## 2020-05-02 DIAGNOSIS — I10 Essential (primary) hypertension: Secondary | ICD-10-CM | POA: Diagnosis not present

## 2020-05-02 DIAGNOSIS — F17210 Nicotine dependence, cigarettes, uncomplicated: Secondary | ICD-10-CM | POA: Diagnosis not present

## 2020-05-02 DIAGNOSIS — Z7902 Long term (current) use of antithrombotics/antiplatelets: Secondary | ICD-10-CM | POA: Diagnosis not present

## 2020-05-02 DIAGNOSIS — M1712 Unilateral primary osteoarthritis, left knee: Secondary | ICD-10-CM | POA: Diagnosis not present

## 2020-05-02 DIAGNOSIS — Z86718 Personal history of other venous thrombosis and embolism: Secondary | ICD-10-CM | POA: Diagnosis not present

## 2020-05-02 DIAGNOSIS — K118 Other diseases of salivary glands: Secondary | ICD-10-CM | POA: Diagnosis not present

## 2020-05-02 DIAGNOSIS — K269 Duodenal ulcer, unspecified as acute or chronic, without hemorrhage or perforation: Secondary | ICD-10-CM | POA: Diagnosis not present

## 2020-05-02 DIAGNOSIS — G319 Degenerative disease of nervous system, unspecified: Secondary | ICD-10-CM | POA: Diagnosis not present

## 2020-05-02 DIAGNOSIS — I69352 Hemiplegia and hemiparesis following cerebral infarction affecting left dominant side: Secondary | ICD-10-CM | POA: Diagnosis not present

## 2020-05-02 DIAGNOSIS — J449 Chronic obstructive pulmonary disease, unspecified: Secondary | ICD-10-CM | POA: Diagnosis not present

## 2020-05-03 DIAGNOSIS — F17210 Nicotine dependence, cigarettes, uncomplicated: Secondary | ICD-10-CM | POA: Diagnosis not present

## 2020-05-03 DIAGNOSIS — Z86010 Personal history of colonic polyps: Secondary | ICD-10-CM | POA: Diagnosis not present

## 2020-05-03 DIAGNOSIS — I10 Essential (primary) hypertension: Secondary | ICD-10-CM | POA: Diagnosis not present

## 2020-05-03 DIAGNOSIS — Z85038 Personal history of other malignant neoplasm of large intestine: Secondary | ICD-10-CM | POA: Diagnosis not present

## 2020-05-03 DIAGNOSIS — M1712 Unilateral primary osteoarthritis, left knee: Secondary | ICD-10-CM | POA: Diagnosis not present

## 2020-05-03 DIAGNOSIS — Z86718 Personal history of other venous thrombosis and embolism: Secondary | ICD-10-CM | POA: Diagnosis not present

## 2020-05-03 DIAGNOSIS — E441 Mild protein-calorie malnutrition: Secondary | ICD-10-CM | POA: Diagnosis not present

## 2020-05-03 DIAGNOSIS — J449 Chronic obstructive pulmonary disease, unspecified: Secondary | ICD-10-CM | POA: Diagnosis not present

## 2020-05-03 DIAGNOSIS — H532 Diplopia: Secondary | ICD-10-CM | POA: Diagnosis not present

## 2020-05-03 DIAGNOSIS — K269 Duodenal ulcer, unspecified as acute or chronic, without hemorrhage or perforation: Secondary | ICD-10-CM | POA: Diagnosis not present

## 2020-05-03 DIAGNOSIS — I69352 Hemiplegia and hemiparesis following cerebral infarction affecting left dominant side: Secondary | ICD-10-CM | POA: Diagnosis not present

## 2020-05-03 DIAGNOSIS — Z7982 Long term (current) use of aspirin: Secondary | ICD-10-CM | POA: Diagnosis not present

## 2020-05-03 DIAGNOSIS — Z89022 Acquired absence of left finger(s): Secondary | ICD-10-CM | POA: Diagnosis not present

## 2020-05-03 DIAGNOSIS — G319 Degenerative disease of nervous system, unspecified: Secondary | ICD-10-CM | POA: Diagnosis not present

## 2020-05-03 DIAGNOSIS — E785 Hyperlipidemia, unspecified: Secondary | ICD-10-CM | POA: Diagnosis not present

## 2020-05-03 DIAGNOSIS — H9192 Unspecified hearing loss, left ear: Secondary | ICD-10-CM | POA: Diagnosis not present

## 2020-05-03 DIAGNOSIS — Z7902 Long term (current) use of antithrombotics/antiplatelets: Secondary | ICD-10-CM | POA: Diagnosis not present

## 2020-05-03 DIAGNOSIS — K118 Other diseases of salivary glands: Secondary | ICD-10-CM | POA: Diagnosis not present

## 2020-05-04 DIAGNOSIS — K269 Duodenal ulcer, unspecified as acute or chronic, without hemorrhage or perforation: Secondary | ICD-10-CM | POA: Diagnosis not present

## 2020-05-04 DIAGNOSIS — E785 Hyperlipidemia, unspecified: Secondary | ICD-10-CM | POA: Diagnosis not present

## 2020-05-04 DIAGNOSIS — M1712 Unilateral primary osteoarthritis, left knee: Secondary | ICD-10-CM | POA: Diagnosis not present

## 2020-05-04 DIAGNOSIS — I10 Essential (primary) hypertension: Secondary | ICD-10-CM | POA: Diagnosis not present

## 2020-05-04 DIAGNOSIS — Z85038 Personal history of other malignant neoplasm of large intestine: Secondary | ICD-10-CM | POA: Diagnosis not present

## 2020-05-04 DIAGNOSIS — G319 Degenerative disease of nervous system, unspecified: Secondary | ICD-10-CM | POA: Diagnosis not present

## 2020-05-04 DIAGNOSIS — E441 Mild protein-calorie malnutrition: Secondary | ICD-10-CM | POA: Diagnosis not present

## 2020-05-04 DIAGNOSIS — Z7982 Long term (current) use of aspirin: Secondary | ICD-10-CM | POA: Diagnosis not present

## 2020-05-04 DIAGNOSIS — H532 Diplopia: Secondary | ICD-10-CM | POA: Diagnosis not present

## 2020-05-04 DIAGNOSIS — J449 Chronic obstructive pulmonary disease, unspecified: Secondary | ICD-10-CM | POA: Diagnosis not present

## 2020-05-04 DIAGNOSIS — Z86010 Personal history of colonic polyps: Secondary | ICD-10-CM | POA: Diagnosis not present

## 2020-05-04 DIAGNOSIS — Z86718 Personal history of other venous thrombosis and embolism: Secondary | ICD-10-CM | POA: Diagnosis not present

## 2020-05-04 DIAGNOSIS — Z7902 Long term (current) use of antithrombotics/antiplatelets: Secondary | ICD-10-CM | POA: Diagnosis not present

## 2020-05-04 DIAGNOSIS — Z89022 Acquired absence of left finger(s): Secondary | ICD-10-CM | POA: Diagnosis not present

## 2020-05-04 DIAGNOSIS — F17210 Nicotine dependence, cigarettes, uncomplicated: Secondary | ICD-10-CM | POA: Diagnosis not present

## 2020-05-04 DIAGNOSIS — K118 Other diseases of salivary glands: Secondary | ICD-10-CM | POA: Diagnosis not present

## 2020-05-04 DIAGNOSIS — H9192 Unspecified hearing loss, left ear: Secondary | ICD-10-CM | POA: Diagnosis not present

## 2020-05-04 DIAGNOSIS — I69352 Hemiplegia and hemiparesis following cerebral infarction affecting left dominant side: Secondary | ICD-10-CM | POA: Diagnosis not present

## 2020-05-05 ENCOUNTER — Ambulatory Visit (INDEPENDENT_AMBULATORY_CARE_PROVIDER_SITE_OTHER): Payer: Medicare Other | Admitting: Adult Health

## 2020-05-05 ENCOUNTER — Encounter: Payer: Self-pay | Admitting: Adult Health

## 2020-05-05 VITALS — BP 123/83 | HR 89 | Ht 71.0 in

## 2020-05-05 DIAGNOSIS — F1011 Alcohol abuse, in remission: Secondary | ICD-10-CM

## 2020-05-05 DIAGNOSIS — E785 Hyperlipidemia, unspecified: Secondary | ICD-10-CM | POA: Diagnosis not present

## 2020-05-05 DIAGNOSIS — I639 Cerebral infarction, unspecified: Secondary | ICD-10-CM | POA: Diagnosis not present

## 2020-05-05 DIAGNOSIS — K118 Other diseases of salivary glands: Secondary | ICD-10-CM

## 2020-05-05 DIAGNOSIS — Z87891 Personal history of nicotine dependence: Secondary | ICD-10-CM | POA: Diagnosis not present

## 2020-05-05 NOTE — Patient Instructions (Signed)
Continue working with home health therapies. Once completed, may consider doing additional outpatient therapies  Continue aspirin 81 mg daily  and atorvastatin 80mg  daily  for secondary stroke prevention  Continue to follow up with PCP regarding cholesterol and blood pressure management  Maintain strict control of hypertension with blood pressure goal below 130/90 and cholesterol with LDL cholesterol (bad cholesterol) goal below 70 mg/dL.      Followup in the future with me in 3 months or call earlier if needed      Thank you for coming to see [place] at [facility]. I hope we have been able to provide you high quality care today.  You may receive a patient satisfaction survey over the next few weeks. We would appreciate your feedback and comments so that we may continue to improve ourselves and the health of our patients.     Stroke Prevention Some medical conditions and behaviors are associated with a higher chance of having a stroke. You can help prevent a stroke by making nutrition, lifestyle, and other changes, including managing any medical conditions you may have. What nutrition changes can be made?   Eat healthy foods. You can do this by: ? Choosing foods high in fiber, such as fresh fruits and vegetables and whole grains. ? Eating at least 5 or more servings of fruits and vegetables a day. Try to fill half of your plate at each meal with fruits and vegetables. ? Choosing lean protein foods, such as lean cuts of meat, poultry without skin, fish, tofu, beans, and nuts. ? Eating low-fat dairy products. ? Avoiding foods that are high in salt (sodium). This can help lower blood pressure. ? Avoiding foods that have saturated fat, trans fat, and cholesterol. This can help prevent high cholesterol. ? Avoiding processed and premade foods.  Follow your health care provider's specific guidelines for losing weight, controlling high blood pressure (hypertension),  lowering high cholesterol, and managing diabetes. These may include: ? Reducing your daily calorie intake. ? Limiting your daily sodium intake to 1,500 milligrams (mg). ? Using only healthy fats for cooking, such as olive oil, canola oil, or sunflower oil. ? Counting your daily carbohydrate intake. What lifestyle changes can be made?  Maintain a healthy weight. Talk to your health care provider about your ideal weight.  Get at least 30 minutes of moderate physical activity at least 5 days a week. Moderate activity includes brisk walking, biking, and swimming.  Do not use any products that contain nicotine or tobacco, such as cigarettes and e-cigarettes. If you need help quitting, ask your health care provider. It may also be helpful to avoid exposure to secondhand smoke.  Limit alcohol intake to no more than 1 drink a day for nonpregnant women and 2 drinks a day for men. One drink equals 12 oz of beer, 5 oz of wine, or 1 oz of hard liquor.  Stop any illegal drug use.  Avoid taking birth control pills. Talk to your health care provider about the risks of taking birth control pills if: ? You are over 10 years old. ? You smoke. ? You get migraines. ? You have ever had a blood clot. What other changes can be made?  Manage your cholesterol levels. ? Eating a healthy diet is important for preventing high cholesterol. If cholesterol cannot be managed through diet alone, you may also need to take medicines. ? Take any prescribed medicines to control your cholesterol as told by your health care provider.  Manage  your diabetes. ? Eating a healthy diet and exercising regularly are important parts of managing your blood sugar. If your blood sugar cannot be managed through diet and exercise, you may need to take medicines. ? Take any prescribed medicines to control your diabetes as told by your health care provider.  Control your hypertension. ? To reduce your risk of stroke, try to keep your  blood pressure below 130/80. ? Eating a healthy diet and exercising regularly are an important part of controlling your blood pressure. If your blood pressure cannot be managed through diet and exercise, you may need to take medicines. ? Take any prescribed medicines to control hypertension as told by your health care provider. ? Ask your health care provider if you should monitor your blood pressure at home. ? Have your blood pressure checked every year, even if your blood pressure is normal. Blood pressure increases with age and some medical conditions.  Get evaluated for sleep disorders (sleep apnea). Talk to your health care provider about getting a sleep evaluation if you snore a lot or have excessive sleepiness.  Take over-the-counter and prescription medicines only as told by your health care provider. Aspirin or blood thinners (antiplatelets or anticoagulants) may be recommended to reduce your risk of forming blood clots that can lead to stroke.  Make sure that any other medical conditions you have, such as atrial fibrillation or atherosclerosis, are managed. What are the warning signs of a stroke? The warning signs of a stroke can be easily remembered as BEFAST.  B is for balance. Signs include: ? Dizziness. ? Loss of balance or coordination. ? Sudden trouble walking.  E is for eyes. Signs include: ? A sudden change in vision. ? Trouble seeing.  F is for face. Signs include: ? Sudden weakness or numbness of the face. ? The face or eyelid drooping to one side.  A is for arms. Signs include: ? Sudden weakness or numbness of the arm, usually on one side of the body.  S is for speech. Signs include: ? Trouble speaking (aphasia). ? Trouble understanding.  T is for time. ? These symptoms may represent a serious problem that is an emergency. Do not wait to see if the symptoms will go away. Get medical help right away. Call your local emergency services (911 in the [place] Do not  drive yourself to the hospital.  Other signs of stroke may include: ? A sudden, severe headache with no known cause. ? Nausea or vomiting. ? Seizure. Where to find more information For more information, visit:  American Stroke Association: www.strokeassociation.org  National Stroke Association: www.stroke.org Summary  You can prevent a stroke by eating healthy, exercising, not smoking, limiting alcohol intake, and managing any medical conditions you may have.  Do not use any products that contain nicotine or tobacco, such as cigarettes and e-cigarettes. If you need help quitting, ask your health care provider. It may also be helpful to avoid exposure to secondhand smoke.  Remember BEFAST for warning signs of stroke. Get help right away if you or a loved one has any of these signs. This information is not intended to replace advice given to you by your health care provider. Make sure you discuss any questions you have with your health care provider. Document Revised: 05/03/2017 Document Reviewed: 06/26/2016 Elsevier Patient Education  2020 [employer].

## 2020-05-05 NOTE — Progress Notes (Signed)
I agree with the above plan 

## 2020-05-05 NOTE — Progress Notes (Signed)
[facility] [address] [place]. [place] [address] [phone]       HOSPITAL FOLLOW UP NOTE  [name] Date of Birth:  December 17, 1955 Medical Record Number:  [id number]   Reason for Referral:  hospital stroke follow up    SUBJECTIVE:   CHIEF COMPLAINT:  Chief Complaint  Patient presents with  . Hospitalization Follow-up    rm 9  . Cerebrovascular Accident    Pt said he has no new sx since he left the hospital.     HPI:   ArthurArthur Carrillo a 64 y.o.malewith history of for HTN, HLD, ongoing tobacco abuse (1 ppdx 35 years), COPD (not on home O2), active alcoholism (reports 6 beers/day, last drink at 10 PM on 10/24), DVT (blood thinners discontinued 2-3 years ago per patient), colon polyps s/ppartialcolectomy who presented on 03/28/2020 with onset left sided weakness / numbness, diplopia on left gaze, hearing loss on the left.  Personally reviewed hospitalization pertinent progress notes, lab work and imaging with summary provided.  Evaluated by Dr. Leonie Man with stroke work-up revealing R BG/CR infarct s/p tPA likely secondary to small vessel disease source.  Recommended DAPT for 3 weeks and then aspirin alone. No hx of HTN or DM.  LDL 165 -initiate atorvastatin 80 mg daily.  Other stroke risk factors include prior strokes on imaging, tobacco use, EtOH use, and history of DVT.  Other active problems include COPD, parotid mass (ENT f/u outpatient recommended), hx of colon polyps s/p partial colectomy and hypomagnesemia.  Therapy initially recommended discharge to CIR but due to lack of support following rehab stay discharge to SNF on 04/01/2020.  Stroke: R basal ganglia / corona radiata infarct s/p tPA secondary to small vessel disease likely.  Code Stroke CT head No acute abnormality. Chronic R pontine lacune. ASPECTS 10.   MRI R basal ganglia and corona radiata infarct. Old R pontine lacune.   MRA head Unremarkable   MRA neck Unremarkable  vasculature. L parotid mass   Repeat CT head w/ neuro worsening No change  2D EchoEF 60-65%. No source of embolus   LDL165   HgbA1c5.4   VTE prophylaxis - added Lovenox 40 mg sq daily  No antithromboticprior to admission, now on aspirin 81 mg daily. Plan DAPT x 3 weeks then aspirin alone. patient advised to stop use of home motrin d/t bleeding risk added plavix   Therapy recommendations: CIR->SNF d/t lack of support following rehab stay  Disposition: SNF  Today, 05/05/2020, [name] is being seen for hospital follow-up unaccompanied. Reports weakness left side, gait impairment, and dysarthria.  He has since been discharged from SNF and currently residing at home independently.  Receiving HH PT/OT/SLP.  Ambulating with a cane and denies any recent falls.  Denies new or worsening stroke/TIA symptoms. Remains on aspirin 81mg  daily without bleeding or bruising. On atorvastatin 80mg  daily without myalgias. Blood pressure today 123/83 which is patients baseline.  Reports complete abstinence from tobacco and alcohol use.  No concerns at this time.    ROS:   14 system review of systems performed and negative with exception of those listed in HPI  PMH:  Past Medical History:  Diagnosis Date  . Abdominal pain   . Alcoholism ([place])   . Arthritis   . COPD (chronic obstructive pulmonary disease) ([place])   . Depression   . Diarrhea   . Emphysema   . Fibromyalgia   . Fibromyalgia 1996  . GERD (gastroesophageal reflux disease)   . Gum  disease   . Hearing loss   . Hx of blood clots   . Hx of colonic polyps 11/30/2010   tubular adenomas; had prior colectomy  . Hyperlipidemia   . Hypertension   . IBS (irritable bowel syndrome)   . Nausea   . Night sweats   . Vomiting     PSH:  Past Surgical History:  Procedure Laterality Date  . APPENDECTOMY    . COLECTOMY     inguinal rectal colectomy  . COLONOSCOPY    . FOOT SURGERY     left  . HAND SURGERY     for left finger  amputations  . HEMICOLECTOMY  01/24/11  . NOSE SURGERY    . POLYPECTOMY    . PROCTOSCOPY  01/24/11    Social History:  Social History   Socioeconomic History  . Marital status: Single    Spouse name: Not on file  . Number of children: 1  . Years of education: Not on file  . Highest education level: Not on file  Occupational History  . Occupation: Self Employed   Tobacco Use  . Smoking status: Current Every Day Smoker    Packs/day: 1.00    Types: Cigarettes  . Smokeless tobacco: Never Used  Substance and Sexual Activity  . Alcohol use: No    Comment: no alcohol since 01-07-13  . Drug use: No  . Sexual activity: Not on file  Other Topics Concern  . Not on file  Social History Narrative   Daily caffeine    Social Determinants of Health   Financial Resource Strain:   . Difficulty of Paying Living Expenses: Not on file  Food Insecurity:   . Worried About Charity fundraiser in the Last Year: Not on file  . Ran Out of Food in the Last Year: Not on file  Transportation Needs:   . Lack of Transportation (Medical): Not on file  . Lack of Transportation (Non-Medical): Not on file  Physical Activity:   . Days of Exercise per Week: Not on file  . Minutes of Exercise per Session: Not on file  Stress:   . Feeling of Stress : Not on file  Social Connections:   . Frequency of Communication with Friends and Family: Not on file  . Frequency of Social Gatherings with Friends and Family: Not on file  . Attends Religious Services: Not on file  . Active Member of Clubs or Organizations: Not on file  . Attends Archivist Meetings: Not on file  . Marital Status: Not on file  Intimate Partner Violence:   . Fear of Current or Ex-Partner: Not on file  . Emotionally Abused: Not on file  . Physically Abused: Not on file  . Sexually Abused: Not on file    Family History:  Family History  Problem Relation Age of Onset  . Hypertension Mother   . Diabetes Mother   . Breast  cancer Mother   . Breast cancer Sister   . Hypertension Sister   . Stomach cancer Maternal Grandfather   . Hypertension Sister   . Colon cancer Neg Hx     Medications:   Current Outpatient Medications on File Prior to Visit  Medication Sig Dispense Refill  . acetaminophen (TYLENOL) 500 MG tablet Take 1,000 mg by mouth every 6 (six) hours as needed for moderate pain or headache.    Marland Kitchen aspirin EC 81 MG EC tablet Take 1 tablet (81 mg total) by mouth daily. Swallow whole. [place]  tablet 11  . atorvastatin (LIPITOR) 80 MG tablet Take 1 tablet (80 mg total) by mouth daily. (Patient taking differently: Take 80 mg by mouth at bedtime. )    . carbamide peroxide (DEBROX) 6.5 % OTIC solution Place 4 drops into the left ear daily.    . Multiple Vitamin (MULTIVITAMIN WITH MINERALS) TABS tablet Take 1 tablet by mouth daily.    Marland Kitchen tiotropium (SPIRIVA) 18 MCG inhalation capsule Place 18 mcg into inhaler and inhale daily.     Marland Kitchen tiZANidine (ZANAFLEX) 2 MG tablet Take 2 mg by mouth 3 (three) times daily.      No current facility-administered medications on file prior to visit.    Allergies:   Allergies  Allergen Reactions  . Iohexol Hives    IVP CONTRAST      OBJECTIVE:  Physical Exam  Vitals:   05/05/20 1456  BP: 123/83  Pulse: 89  Height: 5\' 11"  (1.803 m)   Body mass index is 21.9 kg/m. No exam data present  Post stroke PHQ 2/9 Depression screen PHQ 2/9 05/05/2020  Decreased Interest 0  Down, Depressed, Hopeless 0  PHQ - 2 Score 0    General: Frail middle-age Caucasian male, seated, in no evident distress Head: head normocephalic and atraumatic.   Neck: supple with no carotid or supraclavicular bruits Cardiovascular: regular rate and rhythm, no murmurs Musculoskeletal: no deformity Skin:  no rash/petichiae Vascular:  Normal pulses all extremities   Neurologic Exam Mental Status: Awake and fully alert.   Mild to moderate dysarthria.  Oriented to place and time. Recent and remote  memory intact. Attention span, concentration and fund of knowledge appropriate. Mood and affect appropriate.  Cranial Nerves: Fundoscopic exam reveals sharp disc margins. Pupils equal, briskly reactive to light. Extraocular movements full without nystagmus. Visual fields full to confrontation.  HOH bilaterally. Facial sensation intact.  Left lower facial weakness.  Tongue, and palate moves normally and symmetrically.  Motor: Normal bulk and tone and strength RUE and RLE.  LUE: 1/5 able to move index and pinky finger, LLE: 4/5 Sensory.: intact to touch , pinprick , position and vibratory sensation.  Coordination: Rapid alternating movements normal on right side. Finger-to-nose and heel-to-shin performed accurately on right side. Gait and Station: Deferred as currently in wheelchair Reflexes: 1+ and symmetric. Toes downgoing.     NIHSS 5 1a.  Level of consciousness 0 1b. LOC questions 0 1c. LOC commands 0 2.  Best gaze 0 3.  Visual 0 4.  Facial palsy 1 5a.  Motor arm-left 3 5b.  Motor arm-right 0 6a.  Motor leg-left 0 6b.  Motor leg-right 0 7.  Limb ataxia 0 8.  Sensory 0 9.  Best language 0 10.  Dysarthria 1 11.  Extinction and inattention 0  Modified Rankin  2-3      ASSESSMENT: [name] is a 64 y.o. year old male presented with left-sided weakness/numbness, diplopia, left gaze and left-sided hearing loss on 03/28/2020 with stroke work-up revealing R BG/CR infarct s/p tPA likely secondary to small vessel disease source. Vascular risk factors include HLD, tobacco use, EtOH use (6 beers/day), hx of DVT, prior stroke on imaging.      PLAN:  1. R BG/CR stroke :  a. Residual deficit: Left hemiparesis, left facial droop and dysarthria.  Continue HH therapy with possible transition to OP once completed b. Continue aspirin 81 mg daily  and atorvastatin for secondary stroke prevention.  c. Discussed secondary stroke prevention measures and importance of close  PCP follow up  for aggressive stroke risk factor management  2. HLD: LDL goal <70. Recent LDL 165.  Initiated atorvastatin 80 mg daily during stroke admission.  Advised to schedule follow-up with PCP and request repeat lipid panel in the next 1 to 2 months as well as ongoing prescribing of statin 3. EtOH and tobacco use: Congratulated on complete cessation since discharge and highly encouraged continuation 4. 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  This included previsit chart review, lab review, study review, order entry, electronic health record documentation, patient education regarding recent stroke and etiology, residual deficits, importance of managing stroke risk factors and answered all questions to patient satisfaction  Frann Rider, [facility]  [facility] [address] [place], [place] [address]  Phone [phone] Fax [phone] Note: This document was prepared with digital dictation and possible smart phrase technology. Any transcriptional errors that result from this process are unintentional.

## 2020-05-06 DIAGNOSIS — Z86718 Personal history of other venous thrombosis and embolism: Secondary | ICD-10-CM | POA: Diagnosis not present

## 2020-05-06 DIAGNOSIS — K118 Other diseases of salivary glands: Secondary | ICD-10-CM | POA: Diagnosis not present

## 2020-05-06 DIAGNOSIS — H532 Diplopia: Secondary | ICD-10-CM | POA: Diagnosis not present

## 2020-05-06 DIAGNOSIS — G319 Degenerative disease of nervous system, unspecified: Secondary | ICD-10-CM | POA: Diagnosis not present

## 2020-05-06 DIAGNOSIS — Z7982 Long term (current) use of aspirin: Secondary | ICD-10-CM | POA: Diagnosis not present

## 2020-05-06 DIAGNOSIS — I10 Essential (primary) hypertension: Secondary | ICD-10-CM | POA: Diagnosis not present

## 2020-05-06 DIAGNOSIS — Z85038 Personal history of other malignant neoplasm of large intestine: Secondary | ICD-10-CM | POA: Diagnosis not present

## 2020-05-06 DIAGNOSIS — F17210 Nicotine dependence, cigarettes, uncomplicated: Secondary | ICD-10-CM | POA: Diagnosis not present

## 2020-05-06 DIAGNOSIS — E441 Mild protein-calorie malnutrition: Secondary | ICD-10-CM | POA: Diagnosis not present

## 2020-05-06 DIAGNOSIS — H9192 Unspecified hearing loss, left ear: Secondary | ICD-10-CM | POA: Diagnosis not present

## 2020-05-06 DIAGNOSIS — M1712 Unilateral primary osteoarthritis, left knee: Secondary | ICD-10-CM | POA: Diagnosis not present

## 2020-05-06 DIAGNOSIS — E785 Hyperlipidemia, unspecified: Secondary | ICD-10-CM | POA: Diagnosis not present

## 2020-05-06 DIAGNOSIS — Z86010 Personal history of colonic polyps: Secondary | ICD-10-CM | POA: Diagnosis not present

## 2020-05-06 DIAGNOSIS — J449 Chronic obstructive pulmonary disease, unspecified: Secondary | ICD-10-CM | POA: Diagnosis not present

## 2020-05-06 DIAGNOSIS — Z7902 Long term (current) use of antithrombotics/antiplatelets: Secondary | ICD-10-CM | POA: Diagnosis not present

## 2020-05-06 DIAGNOSIS — Z89022 Acquired absence of left finger(s): Secondary | ICD-10-CM | POA: Diagnosis not present

## 2020-05-06 DIAGNOSIS — K269 Duodenal ulcer, unspecified as acute or chronic, without hemorrhage or perforation: Secondary | ICD-10-CM | POA: Diagnosis not present

## 2020-05-06 DIAGNOSIS — I69352 Hemiplegia and hemiparesis following cerebral infarction affecting left dominant side: Secondary | ICD-10-CM | POA: Diagnosis not present

## 2020-05-10 DIAGNOSIS — F17210 Nicotine dependence, cigarettes, uncomplicated: Secondary | ICD-10-CM | POA: Diagnosis not present

## 2020-05-10 DIAGNOSIS — I10 Essential (primary) hypertension: Secondary | ICD-10-CM | POA: Diagnosis not present

## 2020-05-10 DIAGNOSIS — Z89022 Acquired absence of left finger(s): Secondary | ICD-10-CM | POA: Diagnosis not present

## 2020-05-10 DIAGNOSIS — J449 Chronic obstructive pulmonary disease, unspecified: Secondary | ICD-10-CM | POA: Diagnosis not present

## 2020-05-10 DIAGNOSIS — G319 Degenerative disease of nervous system, unspecified: Secondary | ICD-10-CM | POA: Diagnosis not present

## 2020-05-10 DIAGNOSIS — Z7982 Long term (current) use of aspirin: Secondary | ICD-10-CM | POA: Diagnosis not present

## 2020-05-10 DIAGNOSIS — Z86718 Personal history of other venous thrombosis and embolism: Secondary | ICD-10-CM | POA: Diagnosis not present

## 2020-05-10 DIAGNOSIS — H532 Diplopia: Secondary | ICD-10-CM | POA: Diagnosis not present

## 2020-05-10 DIAGNOSIS — M1712 Unilateral primary osteoarthritis, left knee: Secondary | ICD-10-CM | POA: Diagnosis not present

## 2020-05-10 DIAGNOSIS — E441 Mild protein-calorie malnutrition: Secondary | ICD-10-CM | POA: Diagnosis not present

## 2020-05-10 DIAGNOSIS — E785 Hyperlipidemia, unspecified: Secondary | ICD-10-CM | POA: Diagnosis not present

## 2020-05-10 DIAGNOSIS — K118 Other diseases of salivary glands: Secondary | ICD-10-CM | POA: Diagnosis not present

## 2020-05-10 DIAGNOSIS — Z85038 Personal history of other malignant neoplasm of large intestine: Secondary | ICD-10-CM | POA: Diagnosis not present

## 2020-05-10 DIAGNOSIS — Z86010 Personal history of colonic polyps: Secondary | ICD-10-CM | POA: Diagnosis not present

## 2020-05-10 DIAGNOSIS — H9192 Unspecified hearing loss, left ear: Secondary | ICD-10-CM | POA: Diagnosis not present

## 2020-05-10 DIAGNOSIS — Z7902 Long term (current) use of antithrombotics/antiplatelets: Secondary | ICD-10-CM | POA: Diagnosis not present

## 2020-05-10 DIAGNOSIS — K269 Duodenal ulcer, unspecified as acute or chronic, without hemorrhage or perforation: Secondary | ICD-10-CM | POA: Diagnosis not present

## 2020-05-10 DIAGNOSIS — I69352 Hemiplegia and hemiparesis following cerebral infarction affecting left dominant side: Secondary | ICD-10-CM | POA: Diagnosis not present

## 2020-05-11 DIAGNOSIS — F17210 Nicotine dependence, cigarettes, uncomplicated: Secondary | ICD-10-CM | POA: Diagnosis not present

## 2020-05-11 DIAGNOSIS — M1712 Unilateral primary osteoarthritis, left knee: Secondary | ICD-10-CM | POA: Diagnosis not present

## 2020-05-11 DIAGNOSIS — H532 Diplopia: Secondary | ICD-10-CM | POA: Diagnosis not present

## 2020-05-11 DIAGNOSIS — Z86718 Personal history of other venous thrombosis and embolism: Secondary | ICD-10-CM | POA: Diagnosis not present

## 2020-05-11 DIAGNOSIS — I10 Essential (primary) hypertension: Secondary | ICD-10-CM | POA: Diagnosis not present

## 2020-05-11 DIAGNOSIS — Z7902 Long term (current) use of antithrombotics/antiplatelets: Secondary | ICD-10-CM | POA: Diagnosis not present

## 2020-05-11 DIAGNOSIS — K118 Other diseases of salivary glands: Secondary | ICD-10-CM | POA: Diagnosis not present

## 2020-05-11 DIAGNOSIS — I69352 Hemiplegia and hemiparesis following cerebral infarction affecting left dominant side: Secondary | ICD-10-CM | POA: Diagnosis not present

## 2020-05-11 DIAGNOSIS — J449 Chronic obstructive pulmonary disease, unspecified: Secondary | ICD-10-CM | POA: Diagnosis not present

## 2020-05-11 DIAGNOSIS — E441 Mild protein-calorie malnutrition: Secondary | ICD-10-CM | POA: Diagnosis not present

## 2020-05-11 DIAGNOSIS — K269 Duodenal ulcer, unspecified as acute or chronic, without hemorrhage or perforation: Secondary | ICD-10-CM | POA: Diagnosis not present

## 2020-05-11 DIAGNOSIS — Z89022 Acquired absence of left finger(s): Secondary | ICD-10-CM | POA: Diagnosis not present

## 2020-05-11 DIAGNOSIS — E785 Hyperlipidemia, unspecified: Secondary | ICD-10-CM | POA: Diagnosis not present

## 2020-05-11 DIAGNOSIS — Z86010 Personal history of colonic polyps: Secondary | ICD-10-CM | POA: Diagnosis not present

## 2020-05-11 DIAGNOSIS — Z7982 Long term (current) use of aspirin: Secondary | ICD-10-CM | POA: Diagnosis not present

## 2020-05-11 DIAGNOSIS — G319 Degenerative disease of nervous system, unspecified: Secondary | ICD-10-CM | POA: Diagnosis not present

## 2020-05-11 DIAGNOSIS — Z85038 Personal history of other malignant neoplasm of large intestine: Secondary | ICD-10-CM | POA: Diagnosis not present

## 2020-05-11 DIAGNOSIS — H9192 Unspecified hearing loss, left ear: Secondary | ICD-10-CM | POA: Diagnosis not present

## 2020-05-25 ENCOUNTER — Telehealth: Payer: Self-pay | Admitting: Adult Health

## 2020-05-25 MED ORDER — ATORVASTATIN CALCIUM 80 MG PO TABS: 80.0000 mg | ORAL_TABLET | Freq: Every day | ORAL | 0 refills | Status: AC

## 2020-05-25 NOTE — Telephone Encounter (Signed)
Pt.'s son Eddie Dibbles is NOT ON DPR. He requests refills for all of dad's meds. He states he's getting ready to run out.  Pharmacy: [id number]

## 2020-05-25 NOTE — Telephone Encounter (Signed)
I called pt son.  I refilled for 30 day the atorvastatin 80mg  po at bedtime for a month.  This would normally come from pcp.  I gave him Raelyn Number NP # to call for future refills.   He will call her.  Pt just got out of rehab facility.

## 2020-05-25 NOTE — Addendum Note (Signed)
Addended by: Brandon Melnick on: 05/25/2020 01:14 PM   Modules accepted: Orders

## 2020-06-01 DIAGNOSIS — J342 Deviated nasal septum: Secondary | ICD-10-CM | POA: Diagnosis not present

## 2020-06-01 DIAGNOSIS — D110 Benign neoplasm of parotid gland: Secondary | ICD-10-CM | POA: Diagnosis not present

## 2020-06-01 DIAGNOSIS — J343 Hypertrophy of nasal turbinates: Secondary | ICD-10-CM | POA: Diagnosis not present

## 2020-06-01 DIAGNOSIS — H6122 Impacted cerumen, left ear: Secondary | ICD-10-CM | POA: Diagnosis not present

## 2020-06-02 ENCOUNTER — Encounter: Payer: Medicare Other | Attending: Physical Medicine & Rehabilitation | Admitting: Physical Medicine & Rehabilitation

## 2020-06-09 DIAGNOSIS — Z8673 Personal history of transient ischemic attack (TIA), and cerebral infarction without residual deficits: Secondary | ICD-10-CM | POA: Diagnosis not present

## 2020-06-09 DIAGNOSIS — J449 Chronic obstructive pulmonary disease, unspecified: Secondary | ICD-10-CM | POA: Diagnosis not present

## 2020-06-09 DIAGNOSIS — Z136 Encounter for screening for cardiovascular disorders: Secondary | ICD-10-CM | POA: Diagnosis not present

## 2020-06-09 DIAGNOSIS — Z131 Encounter for screening for diabetes mellitus: Secondary | ICD-10-CM | POA: Diagnosis not present

## 2020-06-09 DIAGNOSIS — E782 Mixed hyperlipidemia: Secondary | ICD-10-CM | POA: Diagnosis not present

## 2020-06-09 DIAGNOSIS — K219 Gastro-esophageal reflux disease without esophagitis: Secondary | ICD-10-CM | POA: Diagnosis not present

## 2020-06-09 DIAGNOSIS — Z1329 Encounter for screening for other suspected endocrine disorder: Secondary | ICD-10-CM | POA: Diagnosis not present

## 2020-06-09 DIAGNOSIS — Z0001 Encounter for general adult medical examination with abnormal findings: Secondary | ICD-10-CM | POA: Diagnosis not present

## 2020-06-21 ENCOUNTER — Other Ambulatory Visit: Payer: Self-pay | Admitting: Adult Health

## 2020-06-23 ENCOUNTER — Other Ambulatory Visit: Payer: Self-pay

## 2020-06-23 NOTE — Patient Outreach (Signed)
[facility]) Care Management  06/23/2020  [name] 19-Feb-1956 [id number]   Telephone outreach to patient to obtain mRS was successfully completed. MRS= 1.  Thank you, [facility] Care Management Assistant

## 2020-06-30 ENCOUNTER — Telehealth: Payer: Self-pay | Admitting: Adult Health

## 2020-06-30 NOTE — Telephone Encounter (Signed)
I called and LMVM for pt that he needs to receive refills from pcp.  That is relayed when in the office.  He can return call if questions.

## 2020-06-30 NOTE — Telephone Encounter (Signed)
Pt called, why  atorvastatin (LIPITOR) 80 MG tablet refill was refused. Would like a call from the nurse.

## 2020-07-05 ENCOUNTER — Other Ambulatory Visit: Payer: Self-pay | Admitting: Adult Health

## 2020-08-11 ENCOUNTER — Ambulatory Visit: Payer: Medicare Other | Admitting: Adult Health

## 2020-09-12 ENCOUNTER — Ambulatory Visit: Payer: Medicare Other | Admitting: Adult Health

## 2020-09-12 ENCOUNTER — Encounter: Payer: Self-pay | Admitting: Adult Health

## 2020-09-12 NOTE — Progress Notes (Deleted)
[facility] [address] [place]. [place] [address] [phone]       STROKE FOLLOW UP NOTE  [name] Date of Birth:  26-Aug-1955 Medical Record Number:  [id number]   Reason for Referral: stroke follow up    SUBJECTIVE:   CHIEF COMPLAINT:  No chief complaint on file.   HPI:   Today, 09/12/2020, [name] returns for 51-month stroke follow-up  Stable from stroke standpoint without new stroke/TIA symptoms Reports residual ***  Reports compliance on aspirin and atorvastatin -denies associated side effects Blood pressure today ***      History provided for reference purposes only Initial visit 05/05/2020 JM: [name] is being seen for hospital follow-up unaccompanied. Reports weakness left side, gait impairment, and dysarthria.  He has since been discharged from SNF and currently residing at home independently.  Receiving HH PT/OT/SLP.  Ambulating with a cane and denies any recent falls.  Denies new or worsening stroke/TIA symptoms. Remains on aspirin 81mg  daily without bleeding or bruising. On atorvastatin 80mg  daily without myalgias. Blood pressure today 123/83 which is patients baseline.  Reports complete abstinence from tobacco and alcohol use.  No concerns at this time.  Stroke admission 03/28/2028 [name] a 64 y.o.malewith history of for HTN, HLD, ongoing tobacco abuse (1 ppdx 35 years), COPD (not on home O2), active alcoholism (reports 6 beers/day, last drink at 10 PM on 10/24), DVT (blood thinners discontinued 2-3 years ago per patient), colon polyps s/ppartialcolectomy who presented on 03/28/2020 with onset left sided weakness / numbness, diplopia on left gaze, hearing loss on the left.  Personally reviewed hospitalization pertinent progress notes, lab work and imaging with summary provided.  Evaluated by Dr. Leonie Man with stroke work-up revealing R BG/CR infarct s/p tPA likely secondary to small vessel disease source.   Recommended DAPT for 3 weeks and then aspirin alone. No hx of HTN or DM.  LDL 165 -initiate atorvastatin 80 mg daily.  Other stroke risk factors include prior strokes on imaging, tobacco use, EtOH use, and history of DVT.  Other active problems include COPD, parotid mass (ENT f/u outpatient recommended), hx of colon polyps s/p partial colectomy and hypomagnesemia.  Therapy initially recommended discharge to CIR but due to lack of support following rehab stay discharge to SNF on 04/01/2020.  Stroke: R basal ganglia / corona radiata infarct s/p tPA secondary to small vessel disease likely.  Code Stroke CT head No acute abnormality. Chronic R pontine lacune. ASPECTS 10.   MRI R basal ganglia and corona radiata infarct. Old R pontine lacune.   MRA head Unremarkable   MRA neck Unremarkable vasculature. L parotid mass   Repeat CT head w/ neuro worsening No change  2D EchoEF 60-65%. No source of embolus   LDL165   HgbA1c5.4   VTE prophylaxis - added Lovenox 40 mg sq daily  No antithromboticprior to admission, now on aspirin 81 mg daily. Plan DAPT x 3 weeks then aspirin alone. patient advised to stop use of home motrin d/t bleeding risk added plavix   Therapy recommendations: CIR->SNF d/t lack of support following rehab stay  Disposition: SNF    ROS:   14 system review of systems performed and negative with exception of those listed in HPI  PMH:  Past Medical History:  Diagnosis Date  . Abdominal pain   . Alcoholism ([place])   . Arthritis   . COPD (chronic obstructive pulmonary disease) ([place])   . Depression   . Diarrhea   . Emphysema   .  Fibromyalgia   . Fibromyalgia 1996  . GERD (gastroesophageal reflux disease)   . Gum disease   . Hearing loss   . Hx of blood clots   . Hx of colonic polyps 11/30/2010   tubular adenomas; had prior colectomy  . Hyperlipidemia   . Hypertension   . IBS (irritable bowel syndrome)   . Nausea   . Night sweats   . Vomiting      PSH:  Past Surgical History:  Procedure Laterality Date  . APPENDECTOMY    . COLECTOMY     inguinal rectal colectomy  . COLONOSCOPY    . FOOT SURGERY     left  . HAND SURGERY     for left finger amputations  . HEMICOLECTOMY  01/24/11  . NOSE SURGERY    . POLYPECTOMY    . PROCTOSCOPY  01/24/11    Social History:  Social History   Socioeconomic History  . Marital status: Single    Spouse name: Not on file  . Number of children: 1  . Years of education: Not on file  . Highest education level: Not on file  Occupational History  . Occupation: Self Employed   Tobacco Use  . Smoking status: Current Every Day Smoker    Packs/day: 1.00    Types: Cigarettes  . Smokeless tobacco: Never Used  Substance and Sexual Activity  . Alcohol use: No    Comment: no alcohol since 01-07-13  . Drug use: No  . Sexual activity: Not on file  Other Topics Concern  . Not on file  Social History Narrative   Daily caffeine    Social Determinants of Health   Financial Resource Strain: Not on file  Food Insecurity: Not on file  Transportation Needs: Not on file  Physical Activity: Not on file  Stress: Not on file  Social Connections: Not on file  Intimate Partner Violence: Not on file    Family History:  Family History  Problem Relation Age of Onset  . Hypertension Mother   . Diabetes Mother   . Breast cancer Mother   . Breast cancer Sister   . Hypertension Sister   . Stomach cancer Maternal Grandfather   . Hypertension Sister   . Colon cancer Neg Hx     Medications:   Current Outpatient Medications on File Prior to Visit  Medication Sig Dispense Refill  . acetaminophen (TYLENOL) 500 MG tablet Take 1,000 mg by mouth every 6 (six) hours as needed for moderate pain or headache.    Marland Kitchen aspirin EC 81 MG EC tablet Take 1 tablet (81 mg total) by mouth daily. Swallow whole. 30 tablet 11  . atorvastatin (LIPITOR) 80 MG tablet Take 1 tablet (80 mg total) by mouth daily. 30 tablet 0  .  carbamide peroxide (DEBROX) 6.5 % OTIC solution Place 4 drops into the left ear daily.    . Multiple Vitamin (MULTIVITAMIN WITH MINERALS) TABS tablet Take 1 tablet by mouth daily.    Marland Kitchen tiotropium (SPIRIVA) 18 MCG inhalation capsule Place 18 mcg into inhaler and inhale daily.     Marland Kitchen tiZANidine (ZANAFLEX) 2 MG tablet Take 2 mg by mouth 3 (three) times daily.      No current facility-administered medications on file prior to visit.    Allergies:   Allergies  Allergen Reactions  . Iohexol Hives    IVP CONTRAST      OBJECTIVE:  Physical Exam  There were no vitals filed for this visit. There is no height  or weight on file to calculate BMI. No exam data present  General: Frail middle-age Caucasian male, seated, in no evident distress Head: head normocephalic and atraumatic.   Neck: supple with no carotid or supraclavicular bruits Cardiovascular: regular rate and rhythm, no murmurs Musculoskeletal: no deformity Skin:  no rash/petichiae Vascular:  Normal pulses all extremities   Neurologic Exam Mental Status: Awake and fully alert.   Mild to moderate dysarthria.  Oriented to place and time. Recent and remote memory intact. Attention span, concentration and fund of knowledge appropriate. Mood and affect appropriate.  Cranial Nerves: Pupils equal, briskly reactive to light. Extraocular movements full without nystagmus. Visual fields full to confrontation.  HOH bilaterally. Facial sensation intact.  Left lower facial weakness.  Tongue, and palate moves normally and symmetrically.  Motor: Normal bulk and tone and strength RUE and RLE.  LUE: 1/5 able to move index and pinky finger, LLE: 4/5 Sensory.: intact to touch , pinprick , position and vibratory sensation.  Coordination: Rapid alternating movements normal on right side. Finger-to-nose and heel-to-shin performed accurately on right side. Gait and Station: Deferred as currently in wheelchair Reflexes: 1+ and symmetric. Toes downgoing.        ASSESSMENT: [name] is a 65 y.o. year old male presented with left-sided weakness/numbness, diplopia, left gaze and left-sided hearing loss on 03/28/2020 with stroke work-up revealing R BG/CR infarct s/p tPA likely secondary to small vessel disease source. Vascular risk factors include HLD, tobacco use, EtOH use (6 beers/day), hx of DVT, prior stroke on imaging.      PLAN:  1. R BG/CR stroke :  a. Residual deficit: Left hemiparesis, left facial droop and dysarthria.  Continue HH therapy with possible transition to OP once completed b. Continue aspirin 81 mg daily  and atorvastatin for secondary stroke prevention.  c. Discussed secondary stroke prevention measures and importance of close PCP follow up for aggressive stroke risk factor management  2. HLD: LDL goal <70. Recent LDL 165.  Initiated atorvastatin 80 mg daily during stroke admission.  Advised to schedule follow-up with PCP and request repeat lipid panel in the next 1 to 2 months as well as ongoing prescribing of statin 3. EtOH and tobacco use: Congratulated on complete cessation since discharge and highly encouraged continuation 4. 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 Meda Klinefelter, Maud Deed, PA    I spent 45 minutes of face-to-face and non-face-to-face time with patient.  This included previsit chart review, lab review, study review, order entry, electronic health record documentation, patient education regarding recent stroke and etiology, residual deficits, importance of managing stroke risk factors and answered all questions to patient satisfaction  Frann Rider, [facility]  [facility] [address] [place], [place] [address]  Phone [phone] Fax [phone] Note: This document was prepared with digital dictation and possible smart phrase technology.  Any transcriptional errors that result from this process are unintentional.

## 2021-05-04 DEATH — deceased

## 2022-03-24 IMAGING — CT CT HEAD W/O CM
4 series · 16 of 47 positions shown, 18 images · non-contrast
Comparison: Head CT 03/28/2020

CLINICAL DATA: Stroke follow-up, 24 hour status post tPA
administration.

EXAM:
CT HEAD WITHOUT CONTRAST
TECHNIQUE: Contiguous axial images were obtained from the base of the skull
through the vertex without intravenous contrast.

[Series 3: head wo · axial · 0.41mm/px · z∈[-100,+10]mm · 7 of 30 slices shown, 9 images]
[im 4/30  brain]
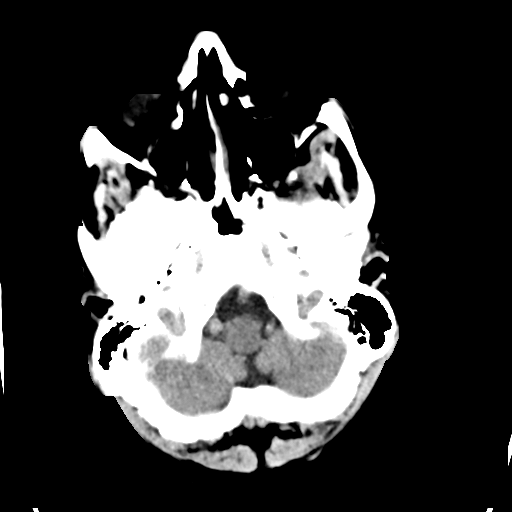
[im 4/30  bone]
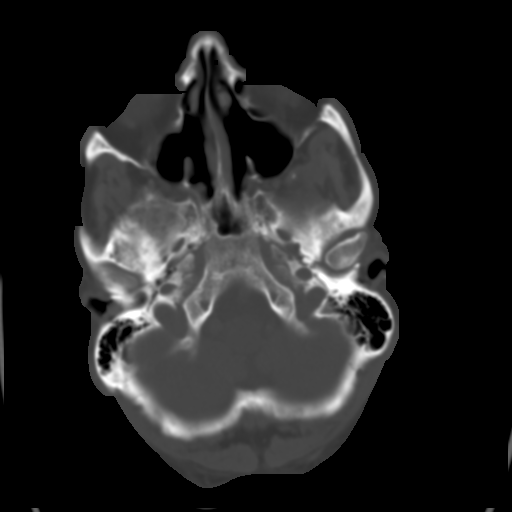
[im 8/30  brain]
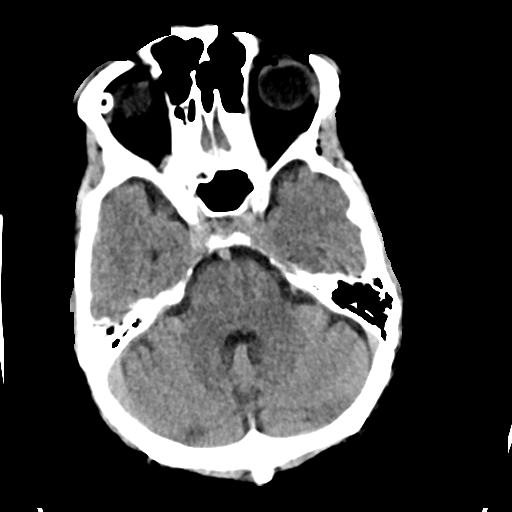
[im 11/30  brain]
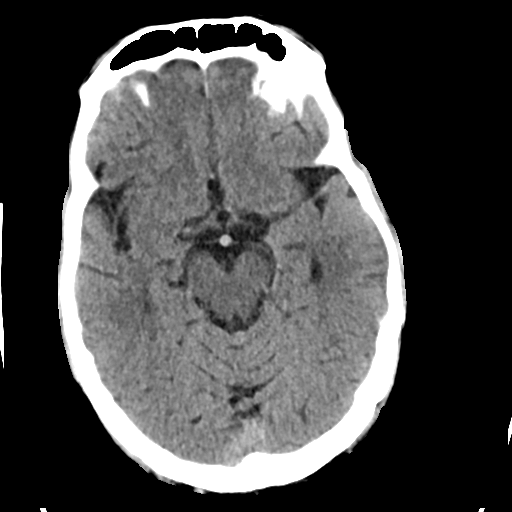
[im 15/30  brain]
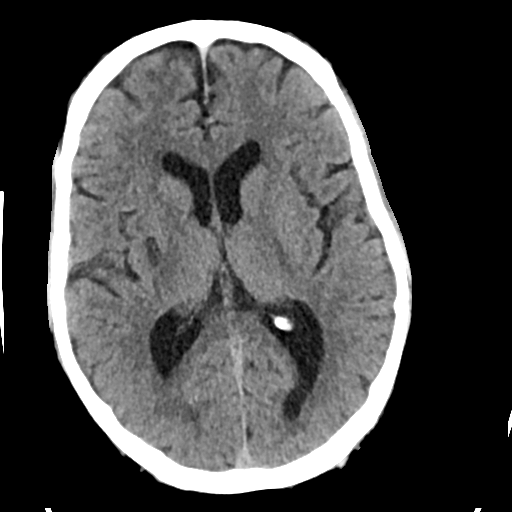
[im 19/30  brain]
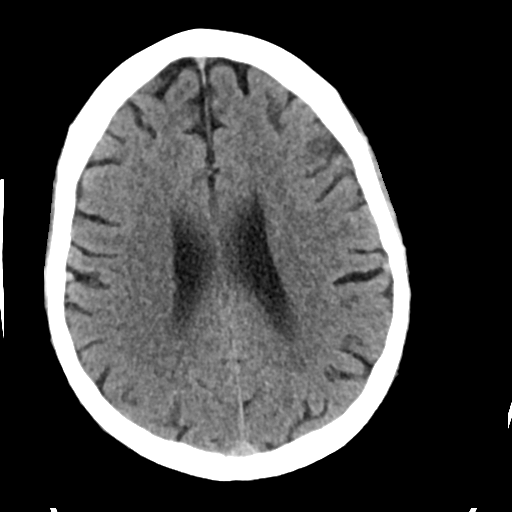
[im 19/30  bone]
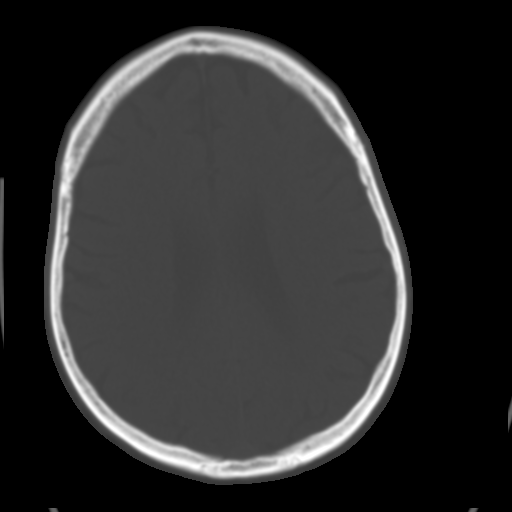
[im 22/30  brain]
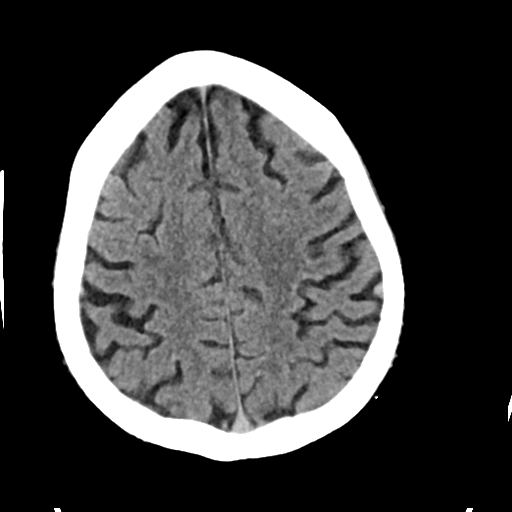
[im 26/30  brain]
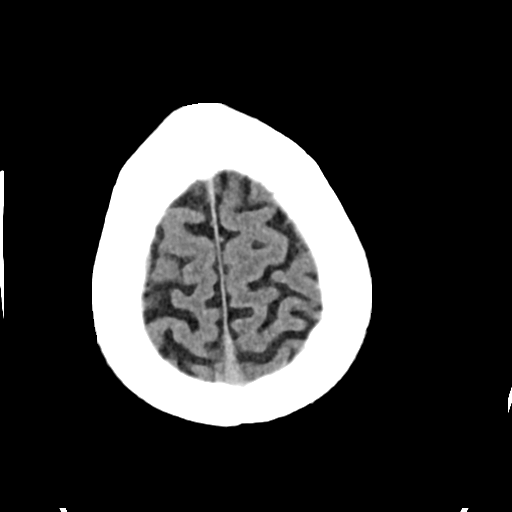

[Series 4: head bone · axial · 0.41mm/px · z∈[-102,-74]mm · 3 of 74 slices shown]
[im 8/74  bone]
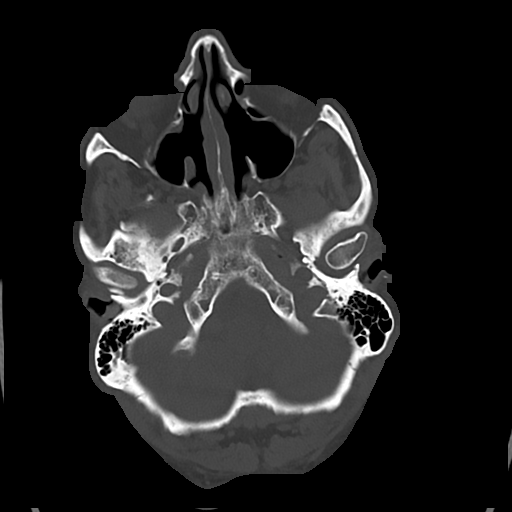
[im 15/74  bone]
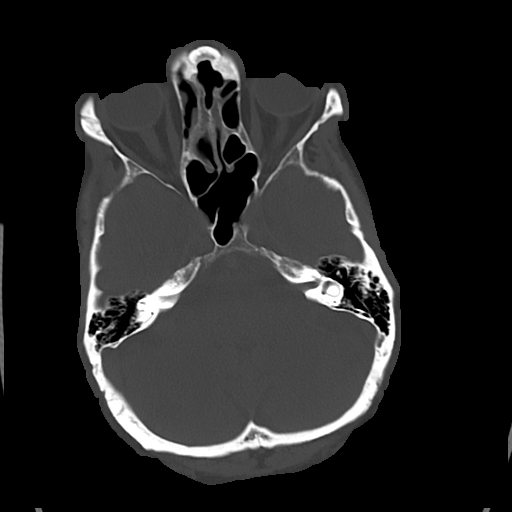
[im 22/74  bone]
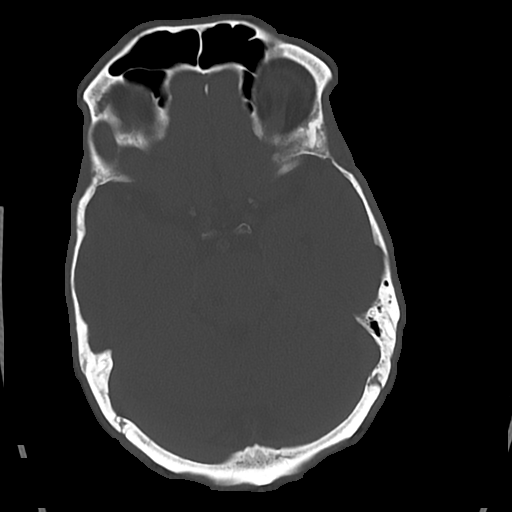

[Series 5: cor soft · coronal · 0.31mm/px · 3 of 71 slices shown]
[im 24/71  brain]
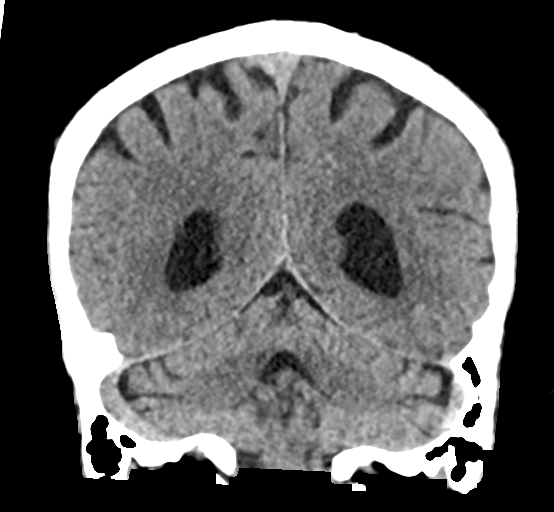
[im 32/71  brain]
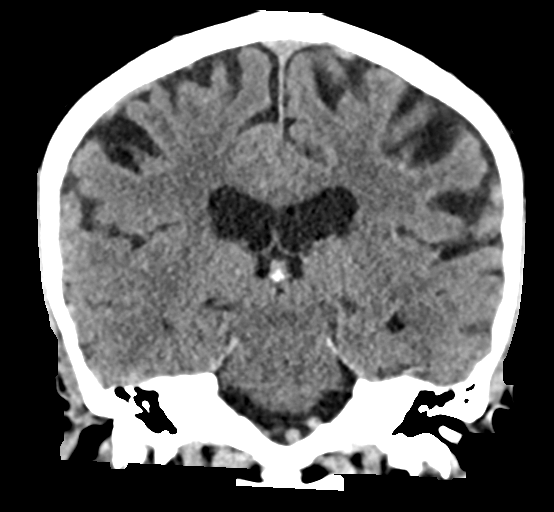
[im 39/71  brain]
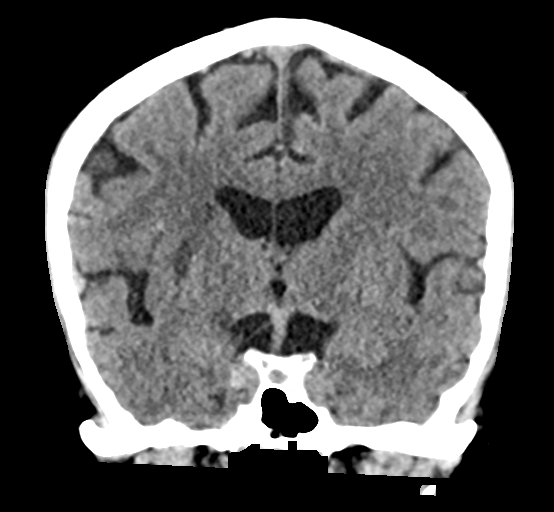

[Series 6: sag soft · sagittal · 0.31mm/px · 3 of 55 slices shown]
[im 19/55  brain]
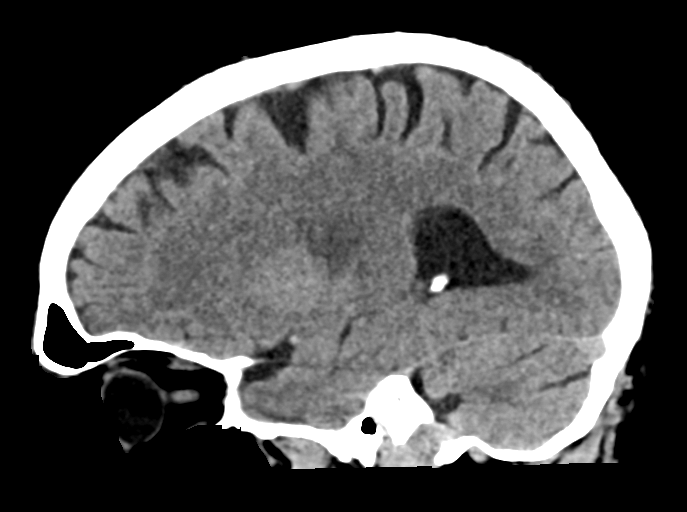
[im 28/55  brain]
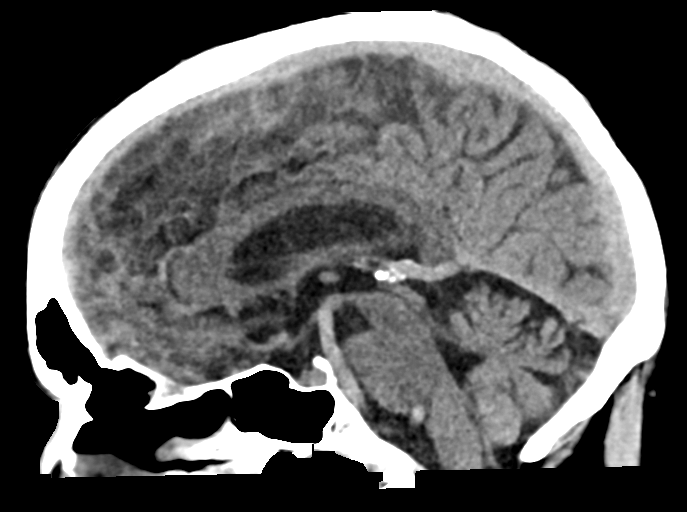
[im 37/55  brain]
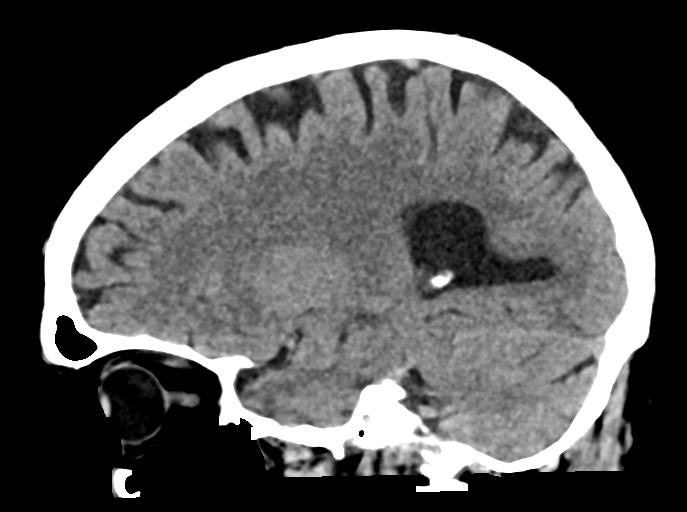

[16 of 47 positions shown; findings below may reference images not displayed]

FINDINGS: Brain: Expected evolution of subacute right basal ganglia small
vessel infarct. No intracranial hemorrhage. No midline shift or mass
effect.

Vascular: No abnormal hyperdensity of the major intracranial
arteries or dural venous sinuses. No intracranial atherosclerosis.

Skull: The visualized skull base, calvarium and extracranial soft
tissues are normal.

Sinuses/Orbits: No fluid levels or advanced mucosal thickening of
the visualized paranasal sinuses. No mastoid or middle ear effusion.
The orbits are normal.
IMPRESSION: Expected evolution of subacute right basal ganglia small vessel
infarct without acute intracranial hemorrhage.
